# Patient Record
Sex: Male | Born: 1945 | Race: White | Hispanic: No | Marital: Married | Smoking: Current every day smoker
Health system: Southern US, Community
[De-identification: ages and names within clinical notes are randomized; demographics above are authoritative.]

## PROBLEM LIST (undated history)

## (undated) HISTORY — PX: BACK SURGERY: SHX140

---

## 2009-08-11 ENCOUNTER — Ambulatory Visit: Payer: Self-pay | Admitting: Unknown Physician Specialty

## 2009-09-23 ENCOUNTER — Encounter: Admission: RE | Admit: 2009-09-23 | Discharge: 2009-09-23 | Payer: Self-pay | Admitting: Neurosurgery

## 2009-11-05 ENCOUNTER — Encounter: Admission: RE | Admit: 2009-11-05 | Discharge: 2009-11-05 | Payer: Self-pay | Admitting: Neurosurgery

## 2010-06-28 ENCOUNTER — Inpatient Hospital Stay (HOSPITAL_COMMUNITY): Admission: RE | Admit: 2010-06-28 | Discharge: 2010-06-29 | Payer: Self-pay | Admitting: Neurosurgery

## 2010-12-22 LAB — CBC
HCT: 46.9 % (ref 39.0–52.0)
MCH: 31.3 pg (ref 26.0–34.0)
MCV: 91.8 fL (ref 78.0–100.0)
Platelets: 274 10*3/uL (ref 150–400)
RDW: 13.9 % (ref 11.5–15.5)

## 2010-12-22 LAB — DIFFERENTIAL
Basophils Absolute: 0.1 10*3/uL (ref 0.0–0.1)
Basophils Relative: 1 % (ref 0–1)
Eosinophils Absolute: 0.3 10*3/uL (ref 0.0–0.7)
Eosinophils Relative: 3 % (ref 0–5)
Monocytes Absolute: 0.7 10*3/uL (ref 0.1–1.0)
Monocytes Relative: 8 % (ref 3–12)

## 2010-12-22 LAB — PROTIME-INR
INR: 0.86 (ref 0.00–1.49)
Prothrombin Time: 11.9 seconds (ref 11.6–15.2)

## 2010-12-22 LAB — COMPREHENSIVE METABOLIC PANEL
AST: 19 U/L (ref 0–37)
Albumin: 4.1 g/dL (ref 3.5–5.2)
Chloride: 104 mEq/L (ref 96–112)
Creatinine, Ser: 0.84 mg/dL (ref 0.4–1.5)
Sodium: 142 mEq/L (ref 135–145)
Total Bilirubin: 0.2 mg/dL — ABNORMAL LOW (ref 0.3–1.2)

## 2010-12-22 LAB — URINALYSIS, ROUTINE W REFLEX MICROSCOPIC
Bilirubin Urine: NEGATIVE
Glucose, UA: NEGATIVE mg/dL
Hgb urine dipstick: NEGATIVE
Specific Gravity, Urine: 1.015 (ref 1.005–1.030)

## 2010-12-22 LAB — TYPE AND SCREEN

## 2010-12-22 LAB — SURGICAL PCR SCREEN
MRSA, PCR: NEGATIVE
Staphylococcus aureus: NEGATIVE

## 2011-08-31 IMAGING — RF DG LUMBAR SPINE 2-3V
1 series · 2 of 2 positions shown · non-contrast
Comparison: 11/05/2009

CLINICAL DATA: L5-S1 discectomy and fusion

LUMBAR SPINE - 2-3 VIEW

[Series 1: run · 2 of 2 slices shown]
[im 1/2]
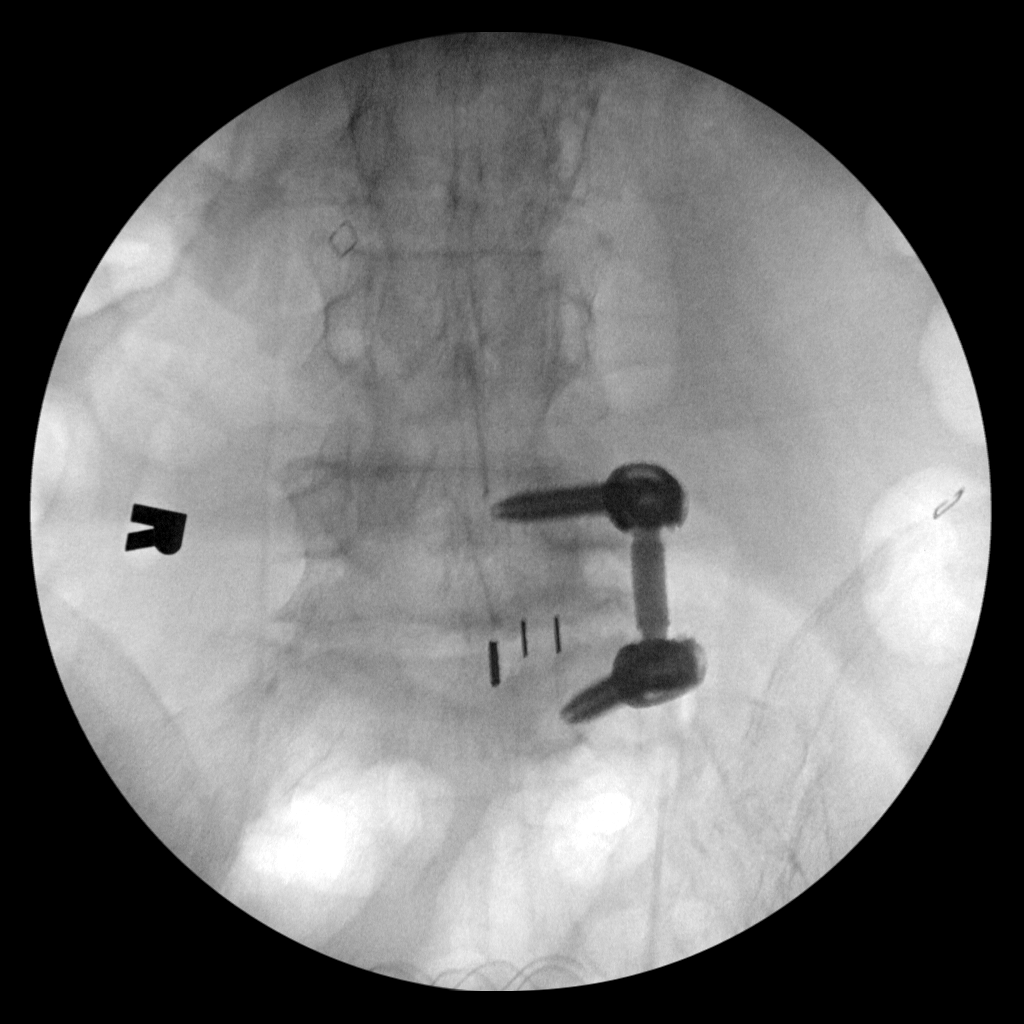
[im 2/2]
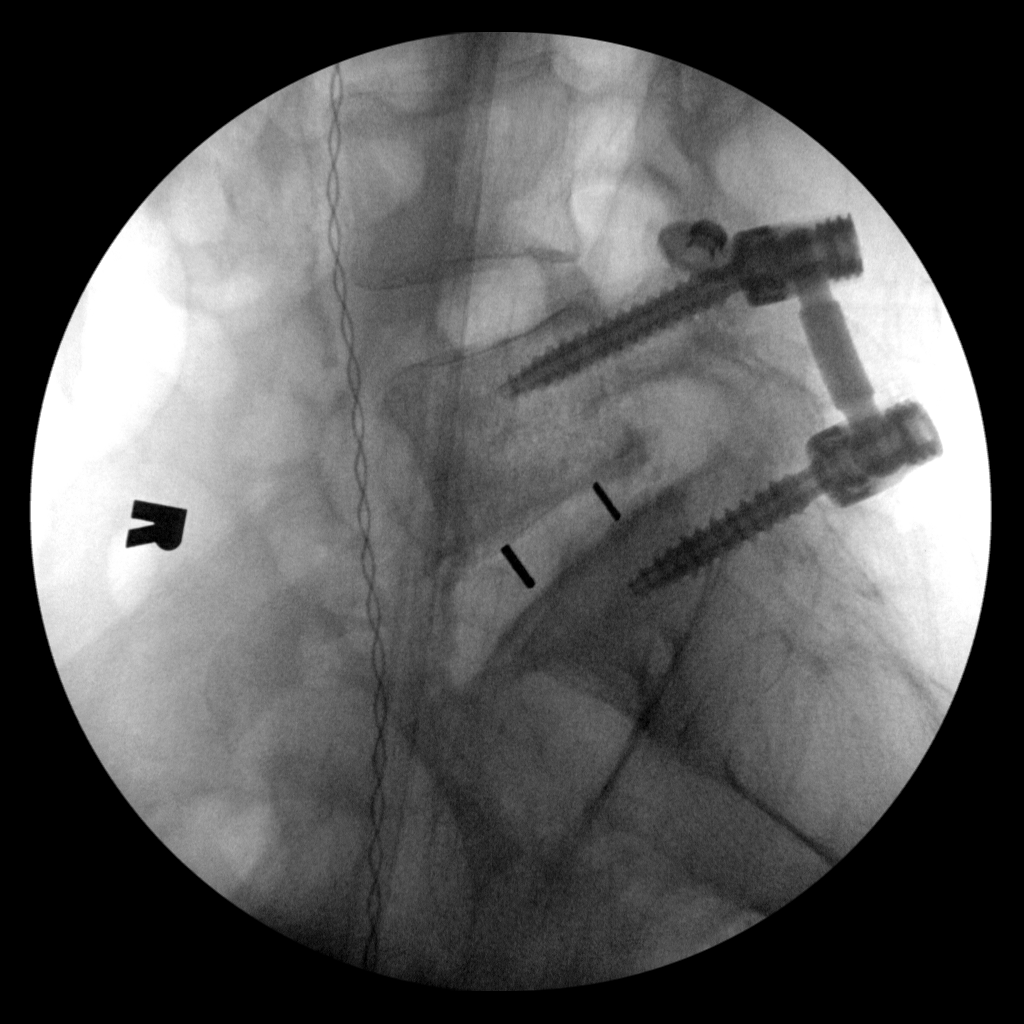

[2 of 2 positions shown; findings below may reference images not displayed]

FINDINGS: Two C-arm images show discectomy at L5-S1.  Interbody
fusion material is in place.  There are left-sided pedicle screws
and posterior connecting rods.  No radiographically detectable
complication.
IMPRESSION: Discectomy and fusion L5-S1.

## 2011-09-12 ENCOUNTER — Other Ambulatory Visit: Payer: Self-pay | Admitting: Neurosurgery

## 2011-09-12 DIAGNOSIS — M47816 Spondylosis without myelopathy or radiculopathy, lumbar region: Secondary | ICD-10-CM

## 2011-09-13 ENCOUNTER — Ambulatory Visit
Admission: RE | Admit: 2011-09-13 | Discharge: 2011-09-13 | Disposition: A | Discharge status: Home / Self Care | Payer: Federal, State, Local not specified - PPO | Source: Ambulatory Visit | Attending: Neurosurgery | Admitting: Neurosurgery

## 2011-09-13 DIAGNOSIS — M47816 Spondylosis without myelopathy or radiculopathy, lumbar region: Secondary | ICD-10-CM

## 2012-11-13 ENCOUNTER — Other Ambulatory Visit: Payer: Self-pay | Admitting: Neurosurgery

## 2012-11-13 DIAGNOSIS — M47817 Spondylosis without myelopathy or radiculopathy, lumbosacral region: Secondary | ICD-10-CM

## 2012-11-14 ENCOUNTER — Ambulatory Visit
Admission: RE | Admit: 2012-11-14 | Discharge: 2012-11-14 | Disposition: A | Discharge status: Home / Self Care | Payer: Medicare Other | Source: Ambulatory Visit | Attending: Neurosurgery | Admitting: Neurosurgery

## 2012-11-14 DIAGNOSIS — M47817 Spondylosis without myelopathy or radiculopathy, lumbosacral region: Secondary | ICD-10-CM

## 2013-09-17 ENCOUNTER — Ambulatory Visit: Payer: Self-pay | Admitting: Orthopedic Surgery

## 2014-06-10 ENCOUNTER — Emergency Department: Payer: Self-pay | Admitting: Emergency Medicine

## 2014-06-10 LAB — COMPREHENSIVE METABOLIC PANEL
ANION GAP: 10 (ref 7–16)
AST: 20 U/L (ref 15–37)
Albumin: 3.4 g/dL (ref 3.4–5.0)
Alkaline Phosphatase: 74 U/L
BUN: 12 mg/dL (ref 7–18)
Bilirubin,Total: 0.5 mg/dL (ref 0.2–1.0)
CHLORIDE: 100 mmol/L (ref 98–107)
CREATININE: 1.07 mg/dL (ref 0.60–1.30)
Calcium, Total: 8.8 mg/dL (ref 8.5–10.1)
Co2: 22 mmol/L (ref 21–32)
EGFR (African American): >60
EGFR (Non-African Amer.): >60
GLUCOSE: 158 mg/dL — AB (ref 65–99)
Osmolality: 268 (ref 275–301)
POTASSIUM: 3.6 mmol/L (ref 3.5–5.1)
SGPT (ALT): 14 U/L
SODIUM: 132 mmol/L — AB (ref 136–145)
TOTAL PROTEIN: 7.6 g/dL (ref 6.4–8.2)

## 2014-06-10 LAB — CBC WITH DIFFERENTIAL/PLATELET
BASOS ABS: 0.1 10*3/uL (ref 0.0–0.1)
Basophil %: 0.4 %
EOS ABS: 0 10*3/uL (ref 0.0–0.7)
EOS PCT: 0 %
HCT: 45.9 % (ref 40.0–52.0)
HGB: 15.4 g/dL (ref 13.0–18.0)
LYMPHS PCT: 3.9 %
Lymphocyte #: 0.6 10*3/uL — ABNORMAL LOW (ref 1.0–3.6)
MCH: 29.3 pg (ref 26.0–34.0)
MCHC: 33.6 g/dL (ref 32.0–36.0)
MCV: 87 fL (ref 80–100)
MONO ABS: 1.3 x10 3/mm — AB (ref 0.2–1.0)
Monocyte %: 8.4 %
NEUTROS PCT: 87.3 %
Neutrophil #: 13.5 10*3/uL — ABNORMAL HIGH (ref 1.4–6.5)
Platelet: 274 10*3/uL (ref 150–440)
RBC: 5.26 10*6/uL (ref 4.40–5.90)
RDW: 15.4 % — AB (ref 11.5–14.5)
WBC: 15.5 10*3/uL — ABNORMAL HIGH (ref 3.8–10.6)

## 2014-06-10 LAB — URINALYSIS, COMPLETE
Bilirubin,UR: NEGATIVE
GLUCOSE, UR: NEGATIVE mg/dL (ref 0–75)
LEUKOCYTE ESTERASE: NEGATIVE
Nitrite: NEGATIVE
Ph: 5 (ref 4.5–8.0)
Protein: 30
RBC,UR: 5 /HPF (ref 0–5)
SQUAMOUS EPITHELIAL: NONE SEEN
Specific Gravity: 1.019 (ref 1.003–1.030)
WBC UR: 4 /HPF (ref 0–5)

## 2014-06-15 LAB — CULTURE, BLOOD (SINGLE)

## 2015-12-17 ENCOUNTER — Emergency Department
Admission: EM | Admit: 2015-12-17 | Discharge: 2015-12-17 | Disposition: A | Discharge status: Home / Self Care | Payer: Medicare Other | Attending: Emergency Medicine | Admitting: Emergency Medicine

## 2015-12-17 ENCOUNTER — Encounter: Payer: Self-pay | Admitting: Medical Oncology

## 2015-12-17 DIAGNOSIS — G8929 Other chronic pain: Secondary | ICD-10-CM

## 2015-12-17 DIAGNOSIS — F17200 Nicotine dependence, unspecified, uncomplicated: Secondary | ICD-10-CM | POA: Diagnosis not present

## 2015-12-17 DIAGNOSIS — M545 Low back pain, unspecified: Secondary | ICD-10-CM

## 2015-12-17 MED ORDER — OXYCODONE-ACETAMINOPHEN 5-325 MG PO TABS
2.0000 | ORAL_TABLET | Freq: Once | ORAL | Status: AC
  Administered 2015-12-17: 2 via ORAL
  Filled 2015-12-17: qty 2

## 2015-12-17 MED ORDER — OXYCODONE-ACETAMINOPHEN 5-325 MG PO TABS: 1.0000 | ORAL_TABLET | ORAL | Status: DC | PRN

## 2015-12-17 MED ORDER — DIAZEPAM 2 MG PO TABS: 2.0000 mg | ORAL_TABLET | Freq: Three times a day (TID) | ORAL | Status: DC | PRN

## 2015-12-17 NOTE — ED Notes (Signed)
Pt to ed with c/o back pain x 3 days. Pt reports hx of several back surgeries but states about 3 days ago lifted some heavy objects and since then pain has increased in back.  Pt reports movement and activity makes pain worse.  States he tried otc meds without relief.

## 2015-12-17 NOTE — Discharge Instructions (Signed)
Follow-up with your back doctor  if any continued problems. You may also follow-[facility]. [facility]oot[facility] if any continued problems. Begin taking Percocet as needed for severe pain and diazepam as needed for muscle spasms. Did not take extra Tylenol with this medication as Percocet already has Tylenol in it. You may also use heat or ice to her back as needed for back discomfort.

## 2015-12-17 NOTE — ED Provider Notes (Signed)
[facility] Emergency Department Provider Note  ____________________________________________  Time seen: Approximately 12:46 PM  I have reviewed the triage vital signs and the nursing notes.   HISTORY  Chief Complaint Back Pain   HPI Adam Fuller is a 70 y.o. male chief complaint of low back pain for 3 days. Patient states that 3 days ago he lifted some heavy objects while he was sawing wood in his chart at home and has continued to have back pain since. He has a history of multiple surgeries on his back but was not currently taking any medication. He has had surgery in both [place] and [place]. He denies any direct trauma to his back or falling. Patient states he has been taking Tylenol without any relief. He denies any paresthesias or urinary symptoms. There is no history of kidney stones in the past. Patient went to the walk in clinic and was directed to the emergency room. Currently he rates his pain 9/10 at this time.   History reviewed. No pertinent past medical history.  There are no active problems to display for this patient.   Past Surgical History  Procedure Laterality Date  . Back surgery      Current Outpatient Rx  Name  Route  Sig  Dispense  Refill  . diazepam (VALIUM) 2 MG tablet   Oral   Take 1 tablet (2 mg total) by mouth every 8 (eight) hours as needed for muscle spasms.   9 tablet   0   . oxyCODONE-acetaminophen (PERCOCET) 5-325 MG tablet   Oral   Take 1 tablet by mouth every 4 (four) hours as needed for severe pain.   20 tablet   0     Allergies Review of patient's allergies indicates no known allergies.  No family history on file.  Social History Social History  Substance Use Topics  . Smoking status: Current Every Day Smoker  . Smokeless tobacco: None  . Alcohol Use: None    Review of Systems Constitutional: No fever/chills Cardiovascular: Denies chest pain. Respiratory: Denies shortness of  breath. Gastrointestinal: No abdominal pain.  No nausea, no vomiting.   Genitourinary: Negative for dysuria. Musculoskeletal: Positive for acute and chronic back pain Skin: Negative for rash. Neurological: Negative for headaches, focal weakness or numbness.  10-point ROS otherwise negative.  ____________________________________________   PHYSICAL EXAM:  VITAL SIGNS: ED Triage Vitals  Enc Vitals Group     BP 12/17/15 1157 171/96 mmHg     Pulse Rate 12/17/15 1157 65     Resp 12/17/15 1157 18     Temp 12/17/15 1157 97.8 F (36.6 C)     Temp Source 12/17/15 1157 Oral     SpO2 12/17/15 1157 100 %     Weight 12/17/15 1157 175 lb (79.379 kg)     Height 12/17/15 1157  (1.803 m)     Head Cir --      Peak Flow --      Pain Score 12/17/15 1158 9     Pain Loc --      Pain Edu? --      Excl. in GC? --     Constitutional: Alert and oriented. Well appearing and in no acute distress. Eyes: Conjunctivae are normal. PERRL. EOMI. Head: Atraumatic. Nose: No congestion/rhinnorhea. Neck: No stridor.   Cardiovascular: Normal rate, regular rhythm. Grossly normal heart sounds.  Good peripheral circulation. Respiratory: Normal respiratory effort.  No retractions. Lungs CTAB. Gastrointestinal: Soft and nontender. No distention.  Musculoskeletal:  Examination of back reveals well-healed surgical scars 2. No gross deformity was noted. There is tenderness on palpation of the lumbar paravertebral muscles bilaterally. Range of motion is restricted secondary to discomfort. Normal gait was noted. Straight leg raises were approximately 70 with some minimal discomfort. Neurologic:  Normal speech and language. No gross focal neurologic deficits are appreciated. No gait instability. Reflexes are 2+ bilaterally. Skin:  Skin is warm, dry and intact. No rash noted. Psychiatric: Mood and affect are normal. Speech and behavior are normal.  ____________________________________________   LABS (all labs  ordered are listed, but only abnormal results are displayed)  Labs Reviewed - No data to display  PROCEDURES  Procedure(s) performed: None  Critical Care performed: No  ____________________________________________   INITIAL IMPRESSION / ASSESSMENT AND PLAN / ED COURSE  Pertinent labs & imaging results that were available during my care of the patient were reviewed by me and considered in my medical decision making (see chart for details).  X-rays were deferred at this time due to no direct trauma to the back. Patient agrees that this most likely is muscle related due to his overactivity in the yard. Patient was given Percocet in the emergency room for pain. He will follow-up with his ____________________________________________   FINAL CLINICAL IMPRESSION(S) / ED DIAGNOSES  Final diagnoses:  Acute bilateral low back pain without sciatica  Chronic lower back pain      Tommi RumpsRhonda L Summers, PA-C 12/17/15 1437  Sharyn CreamerMark Quale, MD 12/17/15 [phone]

## 2015-12-17 NOTE — ED Notes (Signed)
Lower back pain that began a few days ago, has hx of chronic back problems with back surgery in the past

## 2017-12-17 ENCOUNTER — Emergency Department
Admission: EM | Admit: 2017-12-17 | Discharge: 2017-12-17 | Disposition: A | Discharge status: Home / Self Care | Payer: No Typology Code available for payment source | Attending: Emergency Medicine | Admitting: Emergency Medicine

## 2017-12-17 ENCOUNTER — Encounter: Payer: Self-pay | Admitting: Emergency Medicine

## 2017-12-17 ENCOUNTER — Other Ambulatory Visit: Payer: Self-pay

## 2017-12-17 ENCOUNTER — Emergency Department: Payer: No Typology Code available for payment source

## 2017-12-17 DIAGNOSIS — M7918 Myalgia, other site: Secondary | ICD-10-CM

## 2017-12-17 DIAGNOSIS — Y9389 Activity, other specified: Secondary | ICD-10-CM | POA: Insufficient documentation

## 2017-12-17 DIAGNOSIS — F17200 Nicotine dependence, unspecified, uncomplicated: Secondary | ICD-10-CM | POA: Diagnosis not present

## 2017-12-17 DIAGNOSIS — Y999 Unspecified external cause status: Secondary | ICD-10-CM | POA: Diagnosis not present

## 2017-12-17 DIAGNOSIS — M791 Myalgia, unspecified site: Secondary | ICD-10-CM | POA: Diagnosis not present

## 2017-12-17 DIAGNOSIS — Y92410 Unspecified street and highway as the place of occurrence of the external cause: Secondary | ICD-10-CM | POA: Insufficient documentation

## 2017-12-17 DIAGNOSIS — S51811A Laceration without foreign body of right forearm, initial encounter: Secondary | ICD-10-CM | POA: Diagnosis not present

## 2017-12-17 DIAGNOSIS — M79642 Pain in left hand: Secondary | ICD-10-CM | POA: Insufficient documentation

## 2017-12-17 DIAGNOSIS — S59811A Other specified injuries right forearm, initial encounter: Secondary | ICD-10-CM | POA: Diagnosis present

## 2017-12-17 MED ORDER — TRAMADOL HCL 50 MG PO TABS: 50.0000 mg | ORAL_TABLET | Freq: Four times a day (QID) | ORAL | 0 refills | Status: AC | PRN

## 2017-12-17 MED ORDER — SILVER SULFADIAZINE 1 % EX CREA
TOPICAL_CREAM | CUTANEOUS | Status: AC
  Filled 2017-12-17: qty 85

## 2017-12-17 MED ORDER — PHENYLEPHRINE HCL 10 % OP SOLN: Freq: Once | OPHTHALMIC | Status: DC

## 2017-12-17 MED ORDER — SILVER SULFADIAZINE 1 % EX CREA: TOPICAL_CREAM | CUTANEOUS | 1 refills | Status: AC

## 2017-12-17 MED ORDER — SILVER SULFADIAZINE 1 % EX CREA
TOPICAL_CREAM | Freq: Once | CUTANEOUS | Status: AC
  Administered 2017-12-17: 1 via TOPICAL

## 2017-12-17 MED ORDER — LIDOCAINE-EPINEPHRINE-TETRACAINE (LET) SOLUTION
NASAL | Status: AC
  Administered 2017-12-17: 3 mL
  Filled 2017-12-17: qty 3

## 2017-12-17 MED ORDER — LIDOCAINE-EPINEPHRINE-TETRACAINE (LET) TOPICAL GEL
9.0000 mL | Freq: Once | TOPICAL | Status: AC
  Administered 2017-12-17: 9 mL via TOPICAL
  Filled 2017-12-17: qty 9

## 2017-12-17 MED ORDER — OXYCODONE-ACETAMINOPHEN 5-325 MG PO TABS
1.0000 | ORAL_TABLET | Freq: Once | ORAL | Status: AC
  Administered 2017-12-17: 1 via ORAL
  Filled 2017-12-17: qty 1

## 2017-12-17 MED ORDER — CYCLOBENZAPRINE HCL 10 MG PO TABS: 10.0000 mg | ORAL_TABLET | Freq: Three times a day (TID) | ORAL | 0 refills | Status: DC | PRN
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
## 2017-12-17 NOTE — ED Notes (Signed)
Pt does not take any blood thinners. 
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
## 2017-12-20 ENCOUNTER — Encounter: Payer: Medicare Other | Attending: Physician Assistant | Admitting: Physician Assistant

## 2017-12-20 DIAGNOSIS — J449 Chronic obstructive pulmonary disease, unspecified: Secondary | ICD-10-CM | POA: Diagnosis not present

## 2017-12-20 DIAGNOSIS — I252 Old myocardial infarction: Secondary | ICD-10-CM | POA: Insufficient documentation

## 2017-12-20 DIAGNOSIS — S51811A Laceration without foreign body of right forearm, initial encounter: Secondary | ICD-10-CM | POA: Diagnosis present

## 2017-12-20 DIAGNOSIS — I10 Essential (primary) hypertension: Secondary | ICD-10-CM | POA: Insufficient documentation

## 2017-12-20 DIAGNOSIS — W2210XA Striking against or struck by unspecified automobile airbag, initial encounter: Secondary | ICD-10-CM | POA: Diagnosis not present

## 2017-12-20 DIAGNOSIS — I739 Peripheral vascular disease, unspecified: Secondary | ICD-10-CM | POA: Insufficient documentation
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
## 2017-12-23 NOTE — Progress Notes (Signed)
Adam, Fuller ([id number]) Visit Report for 12/20/2017 Allergy List Details Patient Name: Adam Fuller, Adam Fuller. Date of Service: 12/20/2017 8:00 AM Medical Record Number: [id number] Patient Account Number: [id number] Date of Birth/Sex: November 18, 1945 (72 y.o. Male) Treating RN: Curtis Sites Primary Care Alyxandria Wentz: Einar Crow Other Clinician: Referring Sonal Dorwart: Dionne Bucy Treating Temple Ewart/Extender: STONE III, HOYT Weeks in Treatment: 0 Allergies Active Allergies No Known Allergies Allergy Notes Electronic Signature(s) Signed: 12/20/2017 3:33:05 PM By: Curtis Sites Entered By: Curtis Sites on 12/20/2017 08:22:25 Adam Fuller ([id number]) -------------------------------------------------------------------------------- Arrival Information Details Patient Name: Adam Stabs L. Date of Service: 12/20/2017 8:00 AM Medical Record Number: [id number] Patient Account Number: [id number] Date of Birth/Sex: 1945/12/14 (72 y.o. Male) Treating RN: Curtis Sites Primary Care Edem Tiegs: Einar Crow Other Clinician: Referring Emran Molzahn: Dionne Bucy Treating Frankye Schwegel/Extender: Linwood Dibbles, HOYT Weeks in Treatment: 0 Visit Information Patient Arrived: Ambulatory Arrival Time: 08:20 Accompanied By: self Transfer Assistance: None Patient Identification Verified: Yes Secondary Verification Process Completed: Yes Electronic Signature(s) Signed: 12/20/2017 3:33:05 PM By: Curtis Sites Entered By: Curtis Sites on 12/20/2017 08:21:54 Adam Fuller ([id number]) -------------------------------------------------------------------------------- Clinic Level of Care Assessment Details Patient Name: Adam Stabs L. Date of Service: 12/20/2017 8:00 AM Medical Record Number: [id number] Patient Account Number: [id number] Date of Birth/Sex: [birth year] (72 y.o. Male) Treating RN: Ashok Cordia, [name] Primary Care Sharron Simpson: Einar Crow Other  Clinician: Referring Loyda Costin: Dionne Bucy Treating Bridgett Hattabaugh/Extender: Linwood Dibbles, HOYT Weeks in Treatment: 0 Clinic Level of Care Assessment Items TOOL 1 Quantity Score X - Use when EandM and Procedure is performed on INITIAL visit 1 0 ASSESSMENTS - Nursing Assessment / Reassessment X - General Physical Exam (combine w/ comprehensive assessment (listed just below) when 1 20 performed on new pt. evals) X- 1 25 Comprehensive Assessment (HX, ROS, Risk Assessments, Wounds Hx, etc.) ASSESSMENTS - Wound and Skin Assessment / Reassessment []  - Dermatologic / Skin Assessment (not related to wound area) 0 ASSESSMENTS - Ostomy and/or Continence Assessment and Care []  - Incontinence Assessment and Management 0 []  - 0 Ostomy Care Assessment and Management (repouching, etc.) PROCESS - Coordination of Care X - Simple Patient / Family Education for ongoing care 1 15 []  - 0 Complex (extensive) Patient / Family Education for ongoing care []  - 0 Staff obtains Chiropractor, Records, Test Results / Process Orders []  - 0 Staff telephones HHA, Nursing Homes / Clarify orders / etc []  - 0 Routine Transfer to another Facility (non-emergent condition) []  - 0 Routine Hospital Admission (non-emergent condition) X- 1 15 New Admissions / Manufacturing engineer / Ordering NPWT, Apligraf, etc. []  - 0 Emergency Hospital Admission (emergent condition) PROCESS - Special Needs []  - Pediatric / Minor Patient Management 0 []  - 0 Isolation Patient Management []  - 0 Hearing / Language / Visual special needs []  - 0 Assessment of Community assistance (transportation, D/C planning, etc.) []  - 0 Additional assistance / Altered mentation []  - 0 Support Surface(s) Assessment (bed, cushion, seat, etc.) Adam Fuller, Adam L. ([id number]) INTERVENTIONS - Miscellaneous []  - External ear exam 0 []  - 0 Patient Transfer (multiple staff / Nurse, adult / Similar devices) []  - 0 Simple Staple / Suture removal (25 or  less) []  - 0 Complex Staple / Suture removal (26 or more) []  - 0 Hypo/Hyperglycemic Management (do not check if billed separately) []  - 0 Ankle / Brachial Index (ABI) - do not check if billed separately Has the patient been seen at the hospital within the last three years: Yes Total Score: 75 Level Of Care: New/Established - Level 2  Electronic Signature(s) Signed: 12/20/2017 2:24:57 PM By: Alejandro Mulling Entered By: Alejandro Mulling on 12/20/2017 11:40:49 Adam Fuller ([id number]) -------------------------------------------------------------------------------- Encounter Discharge Information Details Patient Name: Adam, [name] Date of Service: 12/20/2017 8:00 AM Medical Record Number: [id number] Patient Account Number: [id number] Date of Birth/Sex: 02-18-1946 (72 y.o. Male) Treating RN: Phillis Haggis Primary Care Hetvi Shawhan: Einar Crow Other Clinician: Referring Ekin Pilar: Dionne Bucy Treating Zariah Cavendish/Extender: Linwood Dibbles, HOYT Weeks in Treatment: 0 Encounter Discharge Information Items Discharge Pain Level: 0 Discharge Condition: Stable Ambulatory Status: Ambulatory Discharge Destination: Home Transportation: Private Auto Schedule Follow-up Appointment: Yes Medication Reconciliation completed and No provided to Patient/Care Federico Maiorino: Provided on Clinical Summary of Care: 12/20/2017 Form Type Recipient Paper Patient MB Electronic Signature(s) Signed: 12/20/2017 2:35:19 PM By: Renne Crigler Entered By: Renne Crigler on 12/20/2017 09:01:49 Adam Fuller ([id number]) -------------------------------------------------------------------------------- Lower Extremity Assessment Details Patient Name: Adam Stabs L. Date of Service: 12/20/2017 8:00 AM Medical Record Number: [id number] Patient Account Number: [id number] Date of Birth/Sex: 04/23/1946 (72 y.o. Male) Treating RN: Curtis Sites Primary Care Lesslie Mckeehan: Einar Crow Other  Clinician: Referring Tawonna Esquer: Dionne Bucy Treating Taccara Bushnell/Extender: Linwood Dibbles, HOYT Weeks in Treatment: 0 Electronic Signature(s) Signed: 12/20/2017 3:33:05 PM By: Curtis Sites Entered By: Curtis Sites on 12/20/2017 08:37:42 Adam Fuller ([id number]) -------------------------------------------------------------------------------- Multi Wound Chart Details Patient Name: Adam Stabs L. Date of Service: 12/20/2017 8:00 AM Medical Record Number: [id number] Patient Account Number: [id number] Date of Birth/Sex: December 25, 1945 (72 y.o. Male) Treating RN: Phillis Haggis Primary Care Damen Windsor: Einar Crow Other Clinician: Referring Banessa Mao: Dionne Bucy Treating Charleton Deyoung/Extender: Linwood Dibbles, HOYT Weeks in Treatment: 0 Vital Signs Height(in): 71 Pulse(bpm): 78 Weight(lbs): 175 Blood Pressure(mmHg): 146/84 Body Mass Index(BMI): 24 Temperature(F): 98.3 Respiratory Rate 18 (breaths/min): Photos: [1:No Photos] [N/A:N/A] Wound Location: [1:Right Forearm - Anterior] [N/A:N/A] Wounding Event: [1:Trauma] [N/A:N/A] Primary Etiology: [1:Trauma, Other] [N/A:N/A] Comorbid History: [1:Chronic Obstructive Pulmonary Disease (COPD)] [N/A:N/A] Date Acquired: [1:12/17/2017] [N/A:N/A] Weeks of Treatment: [1:0] [N/A:N/A] Wound Status: [1:Open] [N/A:N/A] Measurements L x W x D [1:6.5x4x0.1] [N/A:N/A] (cm) Area (cm) : [1:20.42] [N/A:N/A] Volume (cm) : [1:2.042] [N/A:N/A] Classification: [1:Partial Thickness] [N/A:N/A] Exudate Amount: [1:Large] [N/A:N/A] Exudate Type: [1:Serous] [N/A:N/A] Exudate Color: [1:amber] [N/A:N/A] Wound Margin: [1:Flat and Intact] [N/A:N/A] Granulation Amount: [1:Medium (34-66%)] [N/A:N/A] Granulation Quality: [1:Pink] [N/A:N/A] Necrotic Amount: [1:Medium (34-66%)] [N/A:N/A] Exposed Structures: [1:Fascia: No Fat Layer (Subcutaneous Tissue) Exposed: No Tendon: No Muscle: No Joint: No Bone: No] [N/A:N/A] Epithelialization: [1:Small  (1-33%)] [N/A:N/A] Periwound Skin Texture: [1:Excoriation: No Induration: No Callus: No Crepitus: No Rash: No Scarring: No] [N/A:N/A] Periwound Skin Moisture: [1:Maceration: No Dry/Scaly: No] [N/A:N/A] Periwound Skin Color: Ecchymosis: Yes N/A N/A Atrophie Blanche: No Cyanosis: No Erythema: No Hemosiderin Staining: No Mottled: No Pallor: No Rubor: No Temperature: No Abnormality N/A N/A Tenderness on Palpation: Yes N/A N/A Wound Preparation: Ulcer Cleansing: N/A N/A Rinsed/Irrigated with Saline Topical Anesthetic Applied: Other: lidocaine 4% Treatment Notes Electronic Signature(s) Signed: 12/20/2017 2:24:57 PM By: Alejandro Mulling Entered By: Alejandro Mulling on 12/20/2017 08:47:58 Adam Fuller ([id number]) -------------------------------------------------------------------------------- Multi-Disciplinary Care Plan Details Patient Name: Adam Fuller, Adam L. Date of Service: 12/20/2017 8:00 AM Medical Record Number: [id number] Patient Account Number: [id number] Date of Birth/Sex: 1945/10/21 (72 y.o. Male) Treating RN: Ashok Cordia, [name] Primary Care Numan Zylstra: Einar Crow Other Clinician: Referring Aneta Hendershott: Dionne Bucy Treating Mali Eppard/Extender: Linwood Dibbles, HOYT Weeks in Treatment: 0 Active Inactive ` Nutrition Nursing Diagnoses: Imbalanced nutrition Potential for alteratiion in Nutrition/Potential for imbalanced nutrition Goals: Patient/caregiver agrees to and verbalizes understanding of need to use nutritional supplements and/or vitamins as prescribed Date Initiated: 12/20/2017 Target  Resolution Date: 05/18/2018 Goal Status: Active Interventions: Assess patient nutrition upon admission and as needed per policy Notes: ` Orientation to the Wound Care Program Nursing Diagnoses: Knowledge deficit related to the wound healing center program Goals: Patient/caregiver will verbalize understanding of the Wound Healing Center Program Date Initiated:  12/20/2017 Target Resolution Date: 01/12/2018 Goal Status: Active Interventions: Provide education on orientation to the wound center Notes: ` Wound/Skin Impairment Nursing Diagnoses: Impaired tissue integrity Knowledge deficit related to smoking impact on wound healing Knowledge deficit related to ulceration/compromised skin integrity Goals: Ulcer/skin breakdown will have a volume reduction of 80% by week 12 Date Initiated: 12/20/2017 Target Resolution Date: 04/06/2018 Adam Fuller, Adam L. ([id number]) Goal Status: Active Interventions: Assess patient/caregiver ability to perform ulcer/skin care regimen upon admission and as needed Assess ulceration(s) every visit Provide education on smoking Notes: Electronic Signature(s) Signed: 12/20/2017 2:24:57 PM By: Alejandro MullingPinkerton, Debra Entered By: Alejandro MullingPinkerton, Debra on 12/20/2017 08:47:48 Adam Fuller, Adam HammockMICHAEL L. ([id number]) -------------------------------------------------------------------------------- Pain Assessment Details Patient Name: Adam StabsBRADSHER, Adam L. Date of Service: 12/20/2017 8:00 AM Medical Record Number: [id number] Patient Account Number: [id number] Date of Birth/Sex: 12/02/1945 (72 y.o. Male) Treating RN: [name], [name] Primary Care Jaleesa Cervi: [name], [name] Other Clinician: Referring Hue Steveson: Dionne BucySIADECKI, SEBASTIAN Treating Alif Petrak/Extender: Linwood DibblesSTONE III, HOYT Weeks in Treatment: 0 Active Problems Location of Pain Severity and Description of Pain Patient Has Paino Yes Site Locations Pain Location: Pain in Ulcers With Dressing Change: Yes Duration of the Pain. Constant / Intermittento Constant Character of Pain Describe the Pain: Burning Pain Management and Medication Current Pain Management: Notes Topical or injectable lidocaine is offered to patient for acute pain when surgical debridement is performed. If needed, Patient is instructed to use over the counter pain medication for the following 24-48 hours after debridement.  Wound care MDs do not prescribed pain medications. Patient has chronic pain or uncontrolled pain. Patient has been instructed to make an appointment with their Primary Care Physician for pain management. Electronic Signature(s) Signed: 12/20/2017 3:33:05 PM By: [name], [name] Entered By: [name], [name] on 12/20/2017 08:22:12 Adam Fuller, Ala L. ([id number]) -------------------------------------------------------------------------------- Patient/Caregiver Education Details Patient Name: Adam Fuller, Adam L. Date of Service: 12/20/2017 8:00 AM Medical Record Number: [id number] Patient Account Number: [id number] Date of Birth/Gender: 10/25/1945 (72 y.o. Male) Treating RN: Renne CriglerFlinchum, Cheryl Primary Care Physician: [name], [name] Other Clinician: Referring Physician: Dionne BucySIADECKI, SEBASTIAN Treating Physician/Extender: Skeet SimmerSTONE III, HOYT Weeks in Treatment: 0 Education Assessment Education Provided To: Patient Education Topics Provided Smoking and Wound Healing: Handouts: Smoking and Wound Healing Methods: Explain/Verbal Responses: State content correctly Welcome To The [facility]: Handouts: Welcome To The [facility] Methods: Explain/Verbal Responses: State content correctly Wound Debridement: Methods: Explain/Verbal Responses: State content correctly Wound/Skin Impairment: Handouts: Caring for Your Ulcer Methods: Explain/Verbal Responses: State content correctly Electronic Signature(s) Signed: 12/20/2017 2:35:19 PM By: Renne CriglerFlinchum, Cheryl Entered By: Renne CriglerFlinchum, Cheryl on 12/20/2017 09:02:19 Adam Fuller, Keyaan L. ([id number]) -------------------------------------------------------------------------------- Wound Assessment Details Patient Name: Adam Fuller, Adam L. Date of Service: 12/20/2017 8:00 AM Medical Record Number: [id number] Patient Account Number: [id number] Date of Birth/Sex: 12/09/1945 (72 y.o. Male) Treating RN: [name], [name] Primary Care Twain Stenseth: [name],  [name] Other Clinician: Referring Ting Cage: Dionne BucySIADECKI, SEBASTIAN Treating Laurrie Toppin/Extender: Linwood DibblesSTONE III, HOYT Weeks in Treatment: 0 Wound Status Wound Number: 1 Primary Trauma, Other Etiology: Wound Location: Right Forearm - Anterior Wound Status: Open Wounding Event: Trauma Comorbid Chronic Obstructive Pulmonary Disease Date Acquired: 12/17/2017 History: (COPD) Weeks Of Treatment: 0 Clustered Wound: No Photos Photo Uploaded By: [name], [name] on 12/20/2017 10:34:28 Wound Measurements Length: (cm) 6.5 Width: (cm) 4 Depth: (  cm) 0.1 Area: (cm) 20.42 Volume: (cm) 2.042 % Reduction in Area: % Reduction in Volume: Epithelialization: Small (1-33%) Tunneling: No Undermining: No Wound Description Classification: Partial Thickness Wound Margin: Flat and Intact Exudate Amount: Large Exudate Type: Serous Exudate Color: amber Foul Odor After Cleansing: No Slough/Fibrino Yes Wound Bed Granulation Amount: Medium (34-66%) Exposed Structure Granulation Quality: Pink Fascia Exposed: No Necrotic Amount: Medium (34-66%) Fat Layer (Subcutaneous Tissue) Exposed: No Necrotic Quality: Adherent Slough Tendon Exposed: No Muscle Exposed: No Joint Exposed: No Bone Exposed: No Periwound Skin Texture Adam Fuller, Adam L. ([id number]) Texture Color No Abnormalities Noted: No No Abnormalities Noted: No Callus: No Atrophie Blanche: No Crepitus: No Cyanosis: No Excoriation: No Ecchymosis: Yes Induration: No Erythema: No Rash: No Hemosiderin Staining: No Scarring: No Mottled: No Pallor: No Moisture Rubor: No No Abnormalities Noted: No Dry / Scaly: No Temperature / Pain Maceration: No Temperature: No Abnormality Tenderness on Palpation: Yes Wound Preparation Ulcer Cleansing: Rinsed/Irrigated with Saline Topical Anesthetic Applied: Other: lidocaine 4%, Treatment Notes Wound #1 (Right, Anterior Forearm) 1. Cleansed with: Clean wound with Normal Saline 2.  Anesthetic Topical Lidocaine 4% cream to wound bed prior to debridement 4. Dressing Applied: Xeroform 5. Secondary Dressing Applied Non-Adherent pad Notes conform with tape cover with stretch net Electronic Signature(s) Signed: 12/20/2017 3:33:05 PM By: Curtis Sites Entered By: Curtis Sites on 12/20/2017 08:37:23 Adam Fuller ([id number]) -------------------------------------------------------------------------------- Vitals Details Patient Name: Adam Fuller, Adam L. Date of Service: 12/20/2017 8:00 AM Medical Record Number: [id number] Patient Account Number: [id number] Date of Birth/Sex: September 01, 1946 (72 y.o. Male) Treating RN: Curtis Sites Primary Care Naziah Portee: Einar Crow Other Clinician: Referring Laden Fieldhouse: Dionne Bucy Treating Kelijah Towry/Extender: Linwood Dibbles, HOYT Weeks in Treatment: 0 Vital Signs Time Taken: 08:27 Temperature (F): 98.3 Height (in): 71 Pulse (bpm): 78 Source: Measured Respiratory Rate (breaths/min): 18 Weight (lbs): 175 Blood Pressure (mmHg): 146/84 Source: Measured Reference Range: 80 - 120 mg / dl Body Mass Index (BMI): 24.4 Electronic Signature(s) Signed: 12/20/2017 3:33:05 PM By: Curtis Sites Entered By: Curtis Sites on 12/20/2017 08:65:78

## 2017-12-23 NOTE — Progress Notes (Signed)
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 08:27:22 Adam Fuller ([id number]) -------------------------------------------------------------------------------- SuperBill Details Patient Name: Adam Stabs L. Date of Service: 12/20/2017 Medical Record Number: [id number] Patient Account Number: [id number] Date of Birth/Sex: [birth year] (72 y.o. Male) Treating RN: Ashok Cordia, [name] Primary Care Provider: Einar Crow Other Clinician: Referring Provider: Dionne Bucy Treating Provider/Extender: Linwood Dibbles, Breckin Zafar Weeks in Treatment: 0 Diagnosis Coding ICD-10 Codes Code Description S51.801A Unspecified open wound of right forearm, initial encounter L98.492 Non-pressure chronic ulcer of skin of other sites with fat layer  exposed Facility Procedures CPT4 Code: 62952841 Description: [id number] - WOUND CARE VISIT-LEV 2 EST PT Modifier: Quantity: 1 CPT4 Code: 10272536 Description: 11042 - DEB SUBQ TISSUE 20 SQ CM/< ICD-10 Diagnosis Description 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 Non-pressure chronic ulcer of skin of other sites with fat la Modifier: yer exposed Quantity: 1 CPT4 Code: 64403474 Description: 11045 - DEB SUBQ TISS EA ADDL 20CM ICD-10 Diagnosis Description 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 Non-pressure chronic ulcer of skin of other sites with fat la Modifier: yer exposed Quantity: 1 Physician Procedures CPT4 Code: 2595638 Description: 99213 - WC PHYS LEVEL 3 - EST PT ICD-10 Diagnosis Description S51.801A Unspecified open wound of right forearm, initial encounter 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

## 2017-12-27 ENCOUNTER — Encounter: Payer: Medicare Other | Admitting: Nurse Practitioner

## 2017-12-27 DIAGNOSIS — S51811A Laceration without foreign body of right forearm, initial encounter: Secondary | ICD-10-CM | POA: Diagnosis not present

## 2018-01-03 ENCOUNTER — Encounter: Payer: Medicare Other | Admitting: Nurse Practitioner

## 2018-01-03 DIAGNOSIS — S51811A Laceration without foreign body of right forearm, initial encounter: Secondary | ICD-10-CM | POA: Diagnosis not present
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
## 2018-01-06 NOTE — Progress Notes (Signed)
Adam Fuller, Adam L. ([id number]) Visit Report for 01/03/2018 Arrival Information Details Patient Name: Adam Fuller, Adam L. Date of Service: 01/03/2018 9:30 AM Medical Record Number: [id number] Patient Account Number: [id number] Date of Birth/Sex: 11/29/1945 (72 y.o. M) Treating RN: [name], [name] Primary Care Chasey Dull: [name], [name] Other Clinician: Referring Ranvir Renovato: [name], [name] Treating Beautifull Cisar/Extender: [name], [name]: 2 Visit Information History Since Last Visit Added or deleted any medications: No Patient Arrived: Ambulatory Any new allergies or adverse reactions: No Arrival Time: 09:40 Had a fall or experienced change in No Accompanied By: self activities of daily living that may affect Transfer Assistance: None risk of falls: Patient Identification Verified: Yes Signs or symptoms of abuse/neglect since last visito No Secondary Verification Process Completed: Yes Hospitalized since last visit: No Implantable device outside of the clinic excluding No cellular tissue based products placed in the center since last visit: Has Dressing in Place as Prescribed: Yes Pain Present Now: No Electronic Signature(s) Signed: 01/03/2018 4:03:23 PM By: [name], [name] Entered By: [name], [name] on 01/03/2018 09:40:22 Adam Fuller, Adam L. ([id number]) -------------------------------------------------------------------------------- Clinic Level of Care Assessment Details Patient Name: Adam Fuller, Adam L. Date of Service: 01/03/2018 9:30 AM Medical Record Number: [id number] Patient Account Number: [id number] Date of Birth/Sex: 01/25/1946 (72 y.o. M) Treating RN: [name], [name] Primary Care Mahari Strahm: [name], [name] Other Clinician: Referring Kru Allman: [name], [name] Treating Adyson Vanburen/Extender: [name], [name]: 2 Clinic Level of Care Assessment Items TOOL 4 Quantity Score X - Use when only an EandM is performed on FOLLOW-UP visit 1  0 ASSESSMENTS - Nursing Assessment / Reassessment X - Reassessment of Co-morbidities (includes updates in patient status) 1 10 X- 1 5 Reassessment of Adherence to Treatment Plan ASSESSMENTS - Wound and Skin Assessment / Reassessment X - Simple Wound Assessment / Reassessment - one wound 1 5 []  - 0 Complex Wound Assessment / Reassessment - multiple wounds []  - 0 Dermatologic / Skin Assessment (not related to wound area) ASSESSMENTS - Focused Assessment []  - Circumferential Edema Measurements - multi extremities 0 []  - 0 Nutritional Assessment / Counseling / Intervention []  - 0 Lower Extremity Assessment (monofilament, tuning fork, pulses) []  - 0 Peripheral Arterial Disease Assessment (using hand held doppler) ASSESSMENTS - Ostomy and/or Continence Assessment and Care []  - Incontinence Assessment and Management 0 []  - 0 Ostomy Care Assessment and Management (repouching, etc.) PROCESS - Coordination of Care X - Simple Patient / Family Education for ongoing care 1 15 []  - 0 Complex (extensive) Patient / Family Education for ongoing care []  - 0 Staff obtains ChiropractorConsents, Records, Test Results / Process Orders []  - 0 Staff telephones HHA, Nursing Homes / Clarify orders / etc []  - 0 Routine Transfer to another Facility (non-emergent condition) []  - 0 Routine Hospital Admission (non-emergent condition) []  - 0 New Admissions / Manufacturing engineernsurance Authorizations / Ordering NPWT, Apligraf, etc. []  - 0 Emergency Hospital Admission (emergent condition) X- 1 10 Simple Discharge Coordination Adam StabsBRADSHER, Amarii L. ([id number]) []  - 0 Complex (extensive) Discharge Coordination PROCESS - Special Needs []  - Pediatric / Minor Patient Management 0 []  - 0 Isolation Patient Management []  - 0 Hearing / Language / Visual special needs []  - 0 Assessment of Community assistance (transportation, D/C planning, etc.) []  - 0 Additional assistance / Altered mentation []  - 0 Support Surface(s) Assessment  (bed, cushion, seat, etc.) INTERVENTIONS - Wound Cleansing / Measurement X - Simple Wound Cleansing - one wound 1 5 []  - 0 Complex Wound Cleansing - multiple wounds X- 1 5 Wound Imaging (photographs -  any number of wounds) []  - 0 Wound Tracing (instead of photographs) X- 1 5 Simple Wound Measurement - one wound []  - 0 Complex Wound Measurement - multiple wounds INTERVENTIONS - Wound Dressings X - Small Wound Dressing one or multiple wounds 1 10 []  - 0 Medium Wound Dressing one or multiple wounds []  - 0 Large Wound Dressing one or multiple wounds X- 1 5 Application of Medications - topical []  - 0 Application of Medications - injection INTERVENTIONS - Miscellaneous []  - External ear exam 0 []  - 0 Specimen Collection (cultures, biopsies, blood, body fluids, etc.) []  - 0 Specimen(s) / Culture(s) sent or taken to Lab for analysis []  - 0 Patient Transfer (multiple staff / Nurse, adult / Similar devices) []  - 0 Simple Staple / Suture removal (25 or less) []  - 0 Complex Staple / Suture removal (26 or more) []  - 0 Hypo / Hyperglycemic Management (close monitor of Blood Glucose) []  - 0 Ankle / Brachial Index (ABI) - do not check if billed separately X- 1 5 Vital Signs Fariss, Joren L. ([id number]) Has the patient been seen at the hospital within the last three years: Yes Total Score: 80 Level Of Care: New/Established - Level 3 Electronic Signature(s) Signed: 01/03/2018 4:15:03 PM By: Alejandro Mulling Entered By: Alejandro Mulling on 01/03/2018 09:58:17 Adam Fuller ([id number]) -------------------------------------------------------------------------------- Encounter Discharge Information Details Patient Name: Adam Stabs L. Date of Service: 01/03/2018 9:30 AM Medical Record Number: [id number] Patient Account Number: [id number] Date of Birth/Sex: Jun 15, 1946 (72 y.o. M) Treating RN: Phillis Haggis Primary Care Jarrod Mcenery: Einar Crow Other  Clinician: Referring Citlalic Norlander: Einar Crow Treating Necia Kamm/Extender: Kathreen Cosier in Treatment: 2 Encounter Discharge Information Items Discharge Pain Level: 0 Discharge Condition: Stable Ambulatory Status: Ambulatory Discharge Destination: Home Transportation: Private Auto Schedule Follow-up Appointment: Yes Medication Reconciliation completed and No provided to Patient/Care Tyrone Pautsch: Provided on Clinical Summary of Care: 01/03/2018 Form Type Recipient Paper Patient MB Electronic Signature(s) Signed: 01/04/2018 4:26:13 PM By: Renne Crigler Entered By: Renne Crigler on 01/03/2018 10:09:18 Adam Fuller ([id number]) -------------------------------------------------------------------------------- Lower Extremity Assessment Details Patient Name: Adam Stabs L. Date of Service: 01/03/2018 9:30 AM Medical Record Number: [id number] Patient Account Number: [id number] Date of Birth/Sex: August 13, 1946 (72 y.o. M) Treating RN: Curtis Sites Primary Care Stanislaus Kaltenbach: Einar Crow Other Clinician: Referring Corrie Brannen: Einar Crow Treating Ahmeer Tuman/Extender: Bonnell Public Weeks in Treatment: 2 Electronic Signature(s) Signed: 01/03/2018 4:03:23 PM By: Curtis Sites Entered By: Curtis Sites on 01/03/2018 09:45:54 Stammer, Marolyn Hammock ([id number]) -------------------------------------------------------------------------------- Multi Wound Chart Details Patient Name: DUERST, Vlad L. Date of Service: 01/03/2018 9:30 AM Medical Record Number: [id number] Patient Account Number: [id number] Date of Birth/Sex: [birth year] (72 y.o. M) Treating RN: Phillis Haggis Primary Care Kyah Buesing: Einar Crow Other Clinician: Referring Lorine Iannaccone: Einar Crow Treating Brittlyn Cloe/Extender: Kathreen Cosier in Treatment: 2 Vital Signs Height(in): 71 Pulse(bpm): 88 Weight(lbs): 175 Blood Pressure(mmHg): 137/85 Body Mass Index(BMI): 24 Temperature(F):  97.9 Respiratory Rate 16 (breaths/min): Photos: [1:No Photos] [N/A:N/A] Wound Location: [1:Right Forearm - Anterior] [N/A:N/A] Wounding Event: [1:Trauma] [N/A:N/A] Primary Etiology: [1:Trauma, Other] [N/A:N/A] Comorbid History: [1:Chronic Obstructive Pulmonary Disease (COPD)] [N/A:N/A] Date Acquired: [1:12/17/2017] [N/A:N/A] Weeks of Treatment: [1:2] [N/A:N/A] Wound Status: [1:Open] [N/A:N/A] Measurements L x W x D [1:5.2x0.5x0.1] [N/A:N/A] (cm) Area (cm) : [1:2.042] [N/A:N/A] Volume (cm) : [1:0.204] [N/A:N/A] % Reduction in Area: [1:90.00%] [N/A:N/A] % Reduction in Volume: [1:90.00%] [N/A:N/A] Classification: [1:Partial Thickness] [N/A:N/A] Exudate Amount: [1:Medium] [N/A:N/A] Exudate Type: [1:Serous] [N/A:N/A] Exudate Color: [1:amber] [N/A:N/A] Wound Margin: [1:Flat and Intact] [N/A:N/A] Granulation Amount: [1:Large (67-100%)] [N/A:N/A] Granulation  Quality: [1:Pink] [N/A:N/A] Necrotic Amount: [1:Small (1-33%)] [N/A:N/A] Exposed Structures: [1:Fat Layer (Subcutaneous Tissue) Exposed: Yes Fascia: No Tendon: No Muscle: No Joint: No Bone: No] [N/A:N/A] Epithelialization: [1:Medium (34-66%)] [N/A:N/A] Periwound Skin Texture: [1:Scarring: Yes Excoriation: No Induration: No Callus: No Crepitus: No Rash: No] [N/A:N/A] Periwound Skin Moisture: Maceration: No N/A N/A Dry/Scaly: No Periwound Skin Color: Ecchymosis: Yes N/A N/A Atrophie Blanche: No Cyanosis: No Erythema: No Hemosiderin Staining: No Mottled: No Pallor: No Rubor: No Temperature: No Abnormality N/A N/A Tenderness on Palpation: Yes N/A N/A Wound Preparation: Ulcer Cleansing: N/A N/A Rinsed/Irrigated with Saline Topical Anesthetic Applied: Other: lidocaine 4% Treatment Notes Electronic Signature(s) Signed: 01/03/2018 9:54:07 AM By: Bonnell Public Entered By: Bonnell Public on 01/03/2018 09:54:06 Adam Fuller  ([id number]) -------------------------------------------------------------------------------- Multi-Disciplinary Care Plan Details Patient Name: EMERALD, GEHRES L. Date of Service: 01/03/2018 9:30 AM Medical Record Number: [id number] Patient Account Number: [id number] Date of Birth/Sex: 02-10-1946 (72 y.o. M) Treating RN: Phillis Haggis Primary Care Dechelle Attaway: Einar Crow Other Clinician: Referring Marrion Accomando: Einar Crow Treating Jlynn Ly/Extender: Kathreen Cosier in Treatment: 2 Active Inactive ` Nutrition Nursing Diagnoses: Imbalanced nutrition Potential for alteratiion in Nutrition/Potential for imbalanced nutrition Goals: Patient/caregiver agrees to and verbalizes understanding of need to use nutritional supplements and/or vitamins as prescribed Date Initiated: 12/20/2017 Target Resolution Date: 05/18/2018 Goal Status: Active Interventions: Assess patient nutrition upon admission and as needed per policy Notes: ` Orientation to the Wound Care Program Nursing Diagnoses: Knowledge deficit related to the wound healing center program Goals: Patient/caregiver will verbalize understanding of the Wound Healing Center Program Date Initiated: 12/20/2017 Target Resolution Date: 01/12/2018 Goal Status: Active Interventions: Provide education on orientation to the wound center Notes: ` Wound/Skin Impairment Nursing Diagnoses: Impaired tissue integrity Knowledge deficit related to smoking impact on wound healing Knowledge deficit related to ulceration/compromised skin integrity Goals: Ulcer/skin breakdown will have a volume reduction of 80% by week 12 Date Initiated: 12/20/2017 Target Resolution Date: 04/06/2018 MICHELLE, VANHISE ([id number]) Goal Status: Active Interventions: Assess patient/caregiver ability to perform ulcer/skin care regimen upon admission and as needed Assess ulceration(s) every visit Provide education on smoking Notes: Electronic  Signature(s) Signed: 01/03/2018 4:15:03 PM By: Alejandro Mulling Entered By: Alejandro Mulling on 01/03/2018 09:50:36 Adam Fuller ([id number]) -------------------------------------------------------------------------------- Pain Assessment Details Patient Name: Adam Stabs L. Date of Service: 01/03/2018 9:30 AM Medical Record Number: [id number] Patient Account Number: [id number] Date of Birth/Sex: Oct 03, 1946 (72 y.o. M) Treating RN: Curtis Sites Primary Care Laelyn Blumenthal: Einar Crow Other Clinician: Referring Dalin Caldera: Einar Crow Treating Kirah Stice/Extender: Kathreen Cosier in Treatment: 2 Active Problems Location of Pain Severity and Description of Pain Patient Has Paino No Site Locations Pain Management and Medication Current Pain Management: Notes Topical or injectable lidocaine is offered to patient for acute pain when surgical debridement is performed. If needed, Patient is instructed to use over the counter pain medication for the following 24-48 hours after debridement. Wound care MDs do not prescribed pain medications. Patient has chronic pain or uncontrolled pain. Patient has been instructed to make an appointment with their Primary Care Physician for pain management. Electronic Signature(s) Signed: 01/03/2018 4:03:23 PM By: Curtis Sites Entered By: Curtis Sites on 01/03/2018 09:40:29 Adam Fuller ([id number]) -------------------------------------------------------------------------------- Patient/Caregiver Education Details Patient Name: Adam Fuller. Date of Service: 01/03/2018 9:30 AM Medical Record Number: [id number] Patient Account Number: [id number] Date of Birth/Gender: 1946-07-31 (72 y.o. M) Treating RN: Renne Crigler Primary Care Physician: Einar Crow Other Clinician: Referring Physician: Einar Crow Treating Physician/Extender: Kathreen Cosier in Treatment: 2 Education Assessment Education  Provided To: Patient Education Topics Provided Wound Debridement: Handouts: Wound Debridement Methods: Explain/Verbal Responses: State content correctly Wound/Skin Impairment: Handouts: Caring for Your Ulcer Methods: Explain/Verbal Responses: State content correctly Electronic Signature(s) Signed: 01/04/2018 4:26:13 PM By: Renne Crigler Entered By: Renne Crigler on 01/03/2018 10:09:33 Adam Fuller ([id number]) -------------------------------------------------------------------------------- Wound Assessment Details Patient Name: Domingo Sep, Tamari L. Date of Service: 01/03/2018 9:30 AM Medical Record Number: [id number] Patient Account Number: [id number] Date of Birth/Sex: [birth year] (72 y.o. M) Treating RN: Curtis Sites Primary Care Erianna Jolly: Einar Crow Other Clinician: Referring Travontae Freiberger: Einar Crow Treating Lanell Carpenter/Extender: Bonnell Public Weeks in Treatment: 2 Wound Status Wound Number: 1 Primary Trauma, Other Etiology: Wound Location: Right Forearm - Anterior Wound Status: Open Wounding Event: Trauma Comorbid Chronic Obstructive Pulmonary Disease Date Acquired: 12/17/2017 History: (COPD) Weeks Of Treatment: 2 Clustered Wound: No Photos Photo Uploaded By: Curtis Sites on 01/03/2018 09:55:17 Wound Measurements Length: (cm) 5.2 Width: (cm) 0.5 Depth: (cm) 0.1 Area: (cm) 2.042 Volume: (cm) 0.204 % Reduction in Area: 90% % Reduction in Volume: 90% Epithelialization: Medium (34-66%) Tunneling: No Undermining: No Wound Description Classification: Partial Thickness Wound Margin: Flat and Intact Exudate Amount: Medium Exudate Type: Serous Exudate Color: amber Foul Odor After Cleansing: No Slough/Fibrino Yes Wound Bed Granulation Amount: Large (67-100%) Exposed Structure Granulation Quality: Pink Fascia Exposed: No Necrotic Amount: Small (1-33%) Fat Layer (Subcutaneous Tissue) Exposed: Yes Necrotic Quality: Adherent Slough Tendon  Exposed: No Muscle Exposed: No Joint Exposed: No Bone Exposed: No Periwound Skin Texture Kent, Caliber L. ([id number]) Texture Color No Abnormalities Noted: No No Abnormalities Noted: No Callus: No Atrophie Blanche: No Crepitus: No Cyanosis: No Excoriation: No Ecchymosis: Yes Induration: No Erythema: No Rash: No Hemosiderin Staining: No Scarring: Yes Mottled: No Pallor: No Moisture Rubor: No No Abnormalities Noted: No Dry / Scaly: No Temperature / Pain Maceration: No Temperature: No Abnormality Tenderness on Palpation: Yes Wound Preparation Ulcer Cleansing: Rinsed/Irrigated with Saline Topical Anesthetic Applied: Other: lidocaine 4%, Treatment Notes Wound #1 (Right, Anterior Forearm) 1. Cleansed with: Clean wound with Normal Saline 2. Anesthetic Topical Lidocaine 4% cream to wound bed prior to debridement 4. Dressing Applied: Xeroform 5. Secondary Dressing Applied Non-Adherent pad Notes conform with tape cover with stretch net Electronic Signature(s) Signed: 01/03/2018 4:03:23 PM By: Curtis Sites Entered By: Curtis Sites on 01/03/2018 09:45:43 Knouff, Marolyn Hammock ([id number]) -------------------------------------------------------------------------------- Vitals Details Patient Name: Adam Stabs L. Date of Service: 01/03/2018 9:30 AM Medical Record Number: [id number] Patient Account Number: [id number] Date of Birth/Sex: 01/21/1946 (72 y.o. M) Treating RN: Curtis Sites Primary Care Lisandra Mathisen: Einar Crow Other Clinician: Referring Miata Culbreth: Einar Crow Treating Lakiah Dhingra/Extender: Kathreen Cosier in Treatment: 2 Vital Signs Time Taken: 09:40 Temperature (F): 97.9 Height (in): 71 Pulse (bpm): 88 Weight (lbs): 175 Respiratory Rate (breaths/min): 16 Body Mass Index (BMI): 24.4 Blood Pressure (mmHg): 137/85 Reference Range: 80 - 120 mg / dl Electronic Signature(s) Signed: 01/03/2018 4:03:23 PM By: Curtis Sites Entered By:  Curtis Sites on 01/03/2018 09:42:23

## 2018-01-10 ENCOUNTER — Encounter: Payer: Medicare Other | Attending: Nurse Practitioner | Admitting: Nurse Practitioner

## 2018-01-10 DIAGNOSIS — S51801S Unspecified open wound of right forearm, sequela: Secondary | ICD-10-CM | POA: Diagnosis not present

## 2018-01-10 DIAGNOSIS — J449 Chronic obstructive pulmonary disease, unspecified: Secondary | ICD-10-CM | POA: Diagnosis not present

## 2018-01-10 DIAGNOSIS — L98492 Non-pressure chronic ulcer of skin of other sites with fat layer exposed: Secondary | ICD-10-CM | POA: Diagnosis present
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
## 2018-01-12 NOTE — Progress Notes (Signed)
Adam Fuller, Adam Fuller. ([id number]) Visit Report for 01/10/2018 Arrival Information Details Patient Name: Adam Fuller, Adam Fuller. Date of Service: 01/10/2018 10:30 AM Medical Record Number: [id number] Patient Account Number: [id number] Date of Birth/Sex: 05/25/1946 (72 y.o. M) Treating RN: Adam Fuller Primary Care Adam Fuller: Einar Fuller, Adam Other Clinician: Referring Adam Fuller: Einar Fuller, Adam Treating Adam Fuller/Extender: Adam Fuller, Adam Fuller: 3 Visit Information History Since Last Visit Added or deleted any medications: No Patient Arrived: Ambulatory Any new allergies or adverse reactions: No Arrival Time: 10:39 Had a fall or experienced change in No Accompanied By: self activities of daily living that may affect Transfer Assistance: None risk of falls: Patient Identification Verified: Yes Signs or symptoms of abuse/neglect since last visito No Secondary Verification Process Completed: Yes Hospitalized since last visit: No Implantable device outside of the clinic excluding No cellular tissue based products placed in the center since last visit: Has Dressing in Place as Prescribed: Yes Pain Present Now: No Electronic Signature(s) Signed: 01/10/2018 4:24:22 PM By: Adam Fuller Entered By: Adam Fuller on 01/10/2018 10:40:11 Adam Fuller, Adam Fuller. ([id number]) -------------------------------------------------------------------------------- Clinic Level of Care Assessment Details Patient Name: Adam Fuller, Adam Fuller. Date of Service: 01/10/2018 10:30 AM Medical Record Number: [id number] Patient Account Number: [id number] Date of Birth/Sex: 06/28/1946 (72 y.o. M) Treating RN: Adam Fuller Primary Care Celenia Hruska: Einar Fuller, Adam Other Clinician: Referring Adam Fuller: Einar Fuller, Adam Treating Adam Fuller: Adam Fuller, Adam Fuller: 3 Clinic Level of Care Assessment Items TOOL 4 Quantity Score X - Use when only an EandM is performed on FOLLOW-UP visit 1  0 ASSESSMENTS - Nursing Assessment / Reassessment X - Reassessment of Co-morbidities (includes updates in patient status) 1 10 X- 1 5 Reassessment of Adherence to Fuller Plan ASSESSMENTS - Wound and Skin Assessment / Reassessment X - Simple Wound Assessment / Reassessment - one wound 1 5 []  - 0 Complex Wound Assessment / Reassessment - multiple wounds []  - 0 Dermatologic / Skin Assessment (not related to wound area) ASSESSMENTS - Focused Assessment []  - Circumferential Edema Measurements - multi extremities 0 []  - 0 Nutritional Assessment / Counseling / Intervention []  - 0 Lower Extremity Assessment (monofilament, tuning fork, pulses) []  - 0 Peripheral Arterial Disease Assessment (using hand held doppler) ASSESSMENTS - Ostomy and/or Continence Assessment and Care []  - Incontinence Assessment and Management 0 []  - 0 Ostomy Care Assessment and Management (repouching, etc.) PROCESS - Coordination of Care X - Simple Patient / Family Education for ongoing care 1 15 []  - 0 Complex (extensive) Patient / Family Education for ongoing care []  - 0 Staff obtains ChiropractorConsents, Records, Test Results / Process Orders []  - 0 Staff telephones HHA, Nursing Homes / Clarify orders / etc []  - 0 Routine Transfer to another Facility (non-emergent condition) []  - 0 Routine Hospital Admission (non-emergent condition) []  - 0 New Admissions / Manufacturing engineernsurance Authorizations / Ordering NPWT, Apligraf, etc. []  - 0 Emergency Hospital Admission (emergent condition) X- 1 10 Simple Discharge Coordination Adam Fuller, [name]. ([id number]) []  - 0 Complex (extensive) Discharge Coordination PROCESS - Special Needs []  - Pediatric / Minor Patient Management 0 []  - 0 Isolation Patient Management []  - 0 Hearing / Language / Visual special needs []  - 0 Assessment of Community assistance (transportation, D/C planning, etc.) []  - 0 Additional assistance / Altered mentation []  - 0 Support Surface(s) Assessment  (bed, cushion, seat, etc.) INTERVENTIONS - Wound Cleansing / Measurement X - Simple Wound Cleansing - one wound 1 5 []  - 0 Complex Wound Cleansing - multiple wounds X- 1 5 Wound Imaging (photographs -  any number of wounds) []  - 0 Wound Tracing (instead of photographs) []  - 0 Simple Wound Measurement - one wound []  - 0 Complex Wound Measurement - multiple wounds INTERVENTIONS - Wound Dressings []  - Small Wound Dressing one or multiple wounds 0 []  - 0 Medium Wound Dressing one or multiple wounds []  - 0 Large Wound Dressing one or multiple wounds []  - 0 Application of Medications - topical []  - 0 Application of Medications - injection INTERVENTIONS - Miscellaneous []  - External ear exam 0 []  - 0 Specimen Collection (cultures, biopsies, blood, body fluids, etc.) []  - 0 Specimen(s) / Culture(s) sent or taken to Lab for analysis []  - 0 Patient Transfer (multiple staff / Nurse, adult / Similar devices) []  - 0 Simple Staple / Suture removal (25 or less) []  - 0 Complex Staple / Suture removal (26 or more) []  - 0 Hypo / Hyperglycemic Management (close monitor of Blood Glucose) []  - 0 Ankle / Brachial Index (ABI) - do not check if billed separately X- 1 5 Vital Signs Adam Fuller, Adam Fuller. ([id number]) Has the patient been seen at the hospital within the last three years: Yes Total Score: 60 Level Of Care: New/Established - Level 2 Electronic Signature(s) Signed: 01/10/2018 4:32:30 PM By: Adam Fuller Entered By: Adam Fuller on 01/10/2018 12:42:54 Adam Fuller ([id number]) -------------------------------------------------------------------------------- Encounter Discharge Information Details Patient Name: Adam Fuller. Date of Service: 01/10/2018 10:30 AM Medical Record Number: [id number] Patient Account Number: [id number] Date of Birth/Sex: 20-Fuller-1947 (72 y.o. M) Treating RN: Adam Haggis Primary Care Makenzee Choudhry: Einar Crow Other Clinician: Referring  Masiyah Jorstad: Einar Crow Treating Vanden Fawaz/Extender: Adam Cosier in Fuller: 3 Encounter Discharge Information Items Discharge Pain Level: 0 Discharge Condition: Stable Ambulatory Status: Ambulatory Discharge Destination: Home Transportation: Private Auto Accompanied By: self Schedule Follow-up Appointment: No Medication Reconciliation completed and No provided to Patient/Care Cayde Held: Provided on Clinical Summary of Care: 01/10/2018 Form Type Recipient Paper Patient MB Electronic Signature(s) Signed: 01/11/2018 8:34:08 AM By: Gwenlyn Perking Entered By: Gwenlyn Perking on 01/10/2018 10:52:33 Adam Fuller ([id number]) -------------------------------------------------------------------------------- Lower Extremity Assessment Details Patient Name: Adam Fuller, Adam Fuller. Date of Service: 01/10/2018 10:30 AM Medical Record Number: [id number] Patient Account Number: [id number] Date of Birth/Sex: 1946-04-10 (72 y.o. M) Treating RN: Adam Sites Primary Care Loyalty Brashier: Einar Crow Other Clinician: Referring Ramzi Brathwaite: Einar Crow Treating Godwin Tedesco/Extender: Bonnell Public Weeks in Fuller: 3 Electronic Signature(s) Signed: 01/10/2018 4:24:22 PM By: Adam Sites Entered By: Adam Sites on 01/10/2018 10:43:38 Adam Fuller ([id number]) -------------------------------------------------------------------------------- Multi Wound Chart Details Patient Name: Adam Fuller, Adam Fuller. Date of Service: 01/10/2018 10:30 AM Medical Record Number: [id number] Patient Account Number: [id number] Date of Birth/Sex: 1946/03/25 (72 y.o. M) Treating RN: Adam Haggis Primary Care Marte Celani: Einar Crow Other Clinician: Referring Ivelise Castillo: Einar Crow Treating Insiya Oshea/Extender: Adam Cosier in Fuller: 3 Vital Signs Height(in): 71 Pulse(bpm): 79 Weight(lbs): 175 Blood Pressure(mmHg): 145/55 Body Mass Index(BMI):  24 Temperature(F): Respiratory Rate 18 (breaths/min): Photos: [1:No Photos] [N/A:N/A] Wound Location: [1:Right, Anterior Forearm] [N/A:N/A] Wounding Event: [1:Trauma] [N/A:N/A] Primary Etiology: [1:Trauma, Other] [N/A:N/A] Date Acquired: [1:12/17/2017] [N/A:N/A] Weeks of Fuller: [1:3] [N/A:N/A] Wound Status: [1:Healed - Epithelialized] [N/A:N/A] Measurements Fuller x W x D [1:0x0x0] [N/A:N/A] (cm) Area (cm) : [1:0] [N/A:N/A] Volume (cm) : [1:0] [N/A:N/A] % Reduction in Area: [1:100.00%] [N/A:N/A] % Reduction in Volume: [1:100.00%] [N/A:N/A] Classification: [1:Partial Thickness] [N/A:N/A] Periwound Skin Texture: [1:No Abnormalities Noted] [N/A:N/A] Periwound Skin Moisture: [1:No Abnormalities Noted] [N/A:N/A] Periwound Skin Color: [1:No Abnormalities Noted No] [N/A:N/A N/A] Fuller Notes Electronic Signature(s) Signed: 01/10/2018 11:03:54  AM By: Bonnell Public Entered By: Bonnell Public on 01/10/2018 11:03:54 Adam Fuller ([id number]) -------------------------------------------------------------------------------- Multi-Disciplinary Care Plan Details Patient Name: Adam Fuller, Adam Fuller. Date of Service: 01/10/2018 10:30 AM Medical Record Number: [id number] Patient Account Number: [id number] Date of Birth/Sex: Jan 07, 1946 (72 y.o. M) Treating RN: Adam Haggis Primary Care Camora Tremain: Einar Crow Other Clinician: Referring Albirtha Grinage: Einar Crow Treating Frances Joynt/Extender: Adam Cosier in Fuller: 3 Active Inactive Electronic Signature(s) Signed: 01/10/2018 4:32:30 PM By: Adam Fuller Entered By: Adam Fuller on 01/10/2018 10:50:56 Adam Fuller ([id number]) -------------------------------------------------------------------------------- Pain Assessment Details Patient Name: Adam Fuller, Adam Fuller. Date of Service: 01/10/2018 10:30 AM Medical Record Number: [id number] Patient Account Number: [id number] Date of Birth/Sex: 17-Jan-1946 (72 y.o.  M) Treating RN: Adam Sites Primary Care Baelyn Doring: Einar Crow Other Clinician: Referring Chasyn Cinque: Einar Crow Treating Yifan Auker/Extender: Adam Cosier in Fuller: 3 Active Problems Location of Pain Severity and Description of Pain Patient Has Paino No Site Locations Pain Management and Medication Current Pain Management: Electronic Signature(s) Signed: 01/10/2018 4:24:22 PM By: Adam Sites Entered By: Adam Sites on 01/10/2018 10:40:20 Adam Fuller ([id number]) -------------------------------------------------------------------------------- Patient/Caregiver Education Details Patient Name: Adam Fuller. Date of Service: 01/10/2018 10:30 AM Medical Record Number: [id number] Patient Account Number: [id number] Date of Birth/Gender: [birth year] (71 y.o. M) Treating RN: Adam Haggis Primary Care Physician: Einar Crow Other Clinician: Referring Physician: Einar Crow Treating Physician/Extender: Adam Cosier in Fuller: 3 Education Assessment Education Provided To: Patient Education Topics Provided Wound/Skin Impairment: Handouts: Other: Please call our office if you have any questions or concerns. Methods: Explain/Verbal Responses: State content correctly Electronic Signature(s) Signed: 01/10/2018 4:32:30 PM By: Adam Fuller Entered By: Adam Fuller on 01/10/2018 10:52:31 Adam Fuller ([id number]) -------------------------------------------------------------------------------- Wound Assessment Details Patient Name: Adam Fuller, Adam Fuller. Date of Service: 01/10/2018 10:30 AM Medical Record Number: [id number] Patient Account Number: [id number] Date of Birth/Sex: 12/30/1945 (72 y.o. M) Treating RN: Adam Sites Primary Care Tequia Wolman: Einar Crow Other Clinician: Referring Edoardo Laforte: Einar Crow Treating Nyheim Seufert/Extender: Adam Cosier in Fuller: 3 Wound Status Wound Number:  1 Primary Etiology: Trauma, Other Wound Location: Right, Anterior Forearm Wound Status: Healed - Epithelialized Wounding Event: Trauma Date Acquired: 12/17/2017 Weeks Of Fuller: 3 Clustered Wound: No Photos Photo Uploaded By: Adam Sites on 01/10/2018 11:54:16 Wound Measurements Length: (cm) 0 Width: (cm) 0 Depth: (cm) 0 Area: (cm) 0 Volume: (cm) 0 % Reduction in Area: 100% % Reduction in Volume: 100% Wound Description Classification: Partial Thickness Periwound Skin Texture Texture Color No Abnormalities Noted: No No Abnormalities Noted: No Moisture No Abnormalities Noted: No Electronic Signature(s) Signed: 01/10/2018 4:24:22 PM By: Adam Sites Entered By: Adam Sites on 01/10/2018 10:43:30 Adam Fuller ([id number]) -------------------------------------------------------------------------------- Vitals Details Patient Name: Adam Fuller. Date of Service: 01/10/2018 10:30 AM Medical Record Number: [id number] Patient Account Number: [id number] Date of Birth/Sex: 07/09/1946 (72 y.o. M) Treating RN: Adam Sites Primary Care Violett Hobbs: Einar Crow Other Clinician: Referring Sheddrick Lattanzio: Einar Crow Treating Henna Derderian/Extender: Adam Cosier in Fuller: 3 Vital Signs Time Taken: 10:40 Pulse (bpm): 79 Height (in): 71 Respiratory Rate (breaths/min): 18 Weight (lbs): 175 Blood Pressure (mmHg): 145/55 Body Mass Index (BMI): 24.4 Reference Range: 80 - 120 mg / dl Electronic Signature(s) Signed: 01/10/2018 4:24:22 PM By: Adam Sites Entered By: Adam Sites on 01/10/2018 10:41:50
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
## 2022-06-14 ENCOUNTER — Other Ambulatory Visit
Admission: RE | Admit: 2022-06-14 | Discharge: 2022-06-14 | Disposition: A | Discharge status: Home / Self Care | Payer: Medicare Other | Source: Ambulatory Visit | Attending: Family Medicine | Admitting: Family Medicine

## 2022-06-14 DIAGNOSIS — R200 Anesthesia of skin: Secondary | ICD-10-CM | POA: Insufficient documentation

## 2022-06-14 LAB — TROPONIN I (HIGH SENSITIVITY): Troponin I (High Sensitivity): 4 ng/L (ref <18)

## 2022-07-06 ENCOUNTER — Ambulatory Visit: Payer: Federal, State, Local not specified - PPO | Admitting: Internal Medicine

## 2023-04-16 ENCOUNTER — Emergency Department: Payer: Medicare Other

## 2023-04-16 ENCOUNTER — Other Ambulatory Visit: Payer: Self-pay

## 2023-04-16 ENCOUNTER — Emergency Department
Admission: EM | Admit: 2023-04-16 | Discharge: 2023-04-16 | Disposition: A | Discharge status: Home / Self Care | Payer: Medicare Other | Attending: Emergency Medicine | Admitting: Emergency Medicine

## 2023-04-16 DIAGNOSIS — S3992XA Unspecified injury of lower back, initial encounter: Secondary | ICD-10-CM | POA: Diagnosis present

## 2023-04-16 DIAGNOSIS — S32020A Wedge compression fracture of second lumbar vertebra, initial encounter for closed fracture: Secondary | ICD-10-CM | POA: Diagnosis not present

## 2023-04-16 DIAGNOSIS — W19XXXA Unspecified fall, initial encounter: Secondary | ICD-10-CM | POA: Diagnosis not present

## 2023-04-16 MED ORDER — DEXAMETHASONE SODIUM PHOSPHATE 10 MG/ML IJ SOLN
10.0000 mg | Freq: Once | INTRAMUSCULAR | Status: AC
  Administered 2023-04-16: 10 mg via INTRAMUSCULAR
  Filled 2023-04-16: qty 1

## 2023-04-16 MED ORDER — METHYLPREDNISOLONE 4 MG PO TBPK: ORAL_TABLET | ORAL | 0 refills | Status: DC

## 2023-04-16 MED ORDER — DIAZEPAM 2 MG PO TABS
2.0000 mg | ORAL_TABLET | Freq: Once | ORAL | Status: AC
  Administered 2023-04-16: 2 mg via ORAL
  Filled 2023-04-16: qty 1

## 2023-04-16 MED ORDER — MORPHINE SULFATE (PF) 4 MG/ML IV SOLN
4.0000 mg | Freq: Once | INTRAVENOUS | Status: AC
  Administered 2023-04-16: 4 mg via INTRAMUSCULAR
  Filled 2023-04-16: qty 1

## 2023-04-16 MED ORDER — OXYCODONE-ACETAMINOPHEN 5-325 MG PO TABS: 1.0000 | ORAL_TABLET | Freq: Four times a day (QID) | ORAL | 0 refills | Status: DC | PRN

## 2023-04-16 MED ORDER — ONDANSETRON 8 MG PO TBDP
8.0000 mg | ORAL_TABLET | Freq: Once | ORAL | Status: AC
  Administered 2023-04-16: 8 mg via ORAL
  Filled 2023-04-16: qty 1

## 2023-04-16 MED ORDER — DIAZEPAM 2 MG PO TABS: 2.0000 mg | ORAL_TABLET | Freq: Four times a day (QID) | ORAL | 0 refills | Status: DC | PRN

## 2023-04-16 NOTE — ED Triage Notes (Signed)
Pt presents to ED with c/o of back pain from a fall that happened 2 weeks ago. Pt denies incontinence. Pt states UC gave him pain meds and muscle relaxer and they are working. NAD noted. Pt ambulatory with steady gait.

## 2023-04-16 NOTE — ED Provider Triage Note (Signed)
Emergency Medicine Provider Triage Evaluation Note  Adam Fuller, a 77 y.o. male  was evaluated in triage.  Pt complains of mechanical fall 2 weeks ago. He fell onto the concrete patio, trying to sit on a rolling stool. He landed on his back, but denies head injury or LOC. He presented to [facility] 1 week ago for evaluation of ongoing pain and disability. Plain films were negative. RX Norco, prednisone, and muscle relaxant meds are not worling well. He denies bladder/bowel incontinence. Foot drop, or saddle anesthesia. History of lumbar  Fusion surgery.   Review of Systems  Positive: Right LBP Negative: LOC, weakness  Physical Exam  There were no vitals taken for this visit. Gen:   Awake, no distress  NAD3 Resp:  Normal effort CTA MSK:   Moves extremities without difficulty  Other:  LE DTRs 2+  Medical Decision Making  Medically screening exam initiated at 3:15 PM.  Appropriate orders placed.  Adam Fuller was informed that the remainder of the evaluation will be completed by another provider, this initial triage assessment does not replace that evaluation, and the importance of remaining in the ED until their evaluation is complete.  Patient to the ED for evaluation of persistent LBP following a mechanical fall 2 weeks ago.    Adam Hoard, PA-C 04/16/23 1523

## 2023-04-16 NOTE — ED Notes (Signed)
Back brace applied to pt by other staff

## 2023-04-16 NOTE — Progress Notes (Signed)
Orthopedic Tech Progress Note Patient Details:  Adam Fuller January 08, 1946 [id number]  Called in order to [facility] for a LSO   Patient ID: TERRIK SCARCE, male   DOB: 1945-11-15, 77 y.o.   MRN: [id number]  Donald Pore 04/16/2023, 6:03 PM

## 2023-04-16 NOTE — ED Notes (Signed)
Called Ortho for a Stat LSO brace.

## 2023-04-27 ENCOUNTER — Other Ambulatory Visit: Payer: Self-pay

## 2023-04-27 ENCOUNTER — Inpatient Hospital Stay
Admission: RE | Admit: 2023-04-27 | Discharge: 2023-04-27 | Disposition: A | Discharge status: Home / Self Care | Payer: Self-pay | Source: Ambulatory Visit | Attending: Neurosurgery | Admitting: Neurosurgery

## 2023-04-27 DIAGNOSIS — Z049 Encounter for examination and observation for unspecified reason: Secondary | ICD-10-CM

## 2023-04-30 NOTE — Progress Notes (Unsigned)
Referring Physician:  No referring provider defined for this encounter.  Primary Physician:  Adam Regulus, MD  History of Present Illness: 05/08/2023 [name] is here today with a chief complaint of back pain.  He had a fall approximately 3 weeks ago.  He was evaluated and found to have a compression fracture.  He presents today with continued pain with standing and walking.  Laying on his left side helps.  Heating pad is also helped.  He has no leg symptoms.  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  Bowel/Bladder Dysfunction: none  Conservative measures:  Physical therapy:  none Multimodal medical therapy including regular antiinflammatories:  Ibuprofen, Prednisone, Metaxalone, Hydrocodone Injections:  no epidural steroid injections  Past Surgery: 3 previous back surgeries  Adam Fuller has no symptoms of cervical myelopathy.  The symptoms are causing a significant impact on the patient's life.   I have utilized the care everywhere function in epic to review the outside records available from external health systems.  Review of Systems:  A 10 point review of systems is negative, except for the pertinent positives and negatives detailed in the HPI.  Past Medical History: History reviewed. No pertinent past medical history.  Past Surgical History: Past Surgical History:  Procedure Laterality Date   BACK SURGERY      Allergies: Allergies as of 05/08/2023 - Review Complete 05/08/2023  Allergen Reaction Noted   Varenicline Other (See Comments) 02/11/2013    Medications:  Current Outpatient Medications:    ibuprofen (ADVIL) 200 MG tablet, Take 200 mg by mouth every 6 (six) hours as needed., Disp: , Rfl:   Social History: Social History   Tobacco Use   Smoking status: Every Day   Smokeless tobacco: Never  Substance Use Topics   Alcohol use: Yes    Family Medical History: History reviewed. No pertinent family  history.  Physical Examination: Vitals:   05/08/23 1055  BP: 134/84    General: Patient is in no apparent distress. Attention to examination is appropriate.  Neck:   Supple.  Full range of motion.  Respiratory: Patient is breathing without any difficulty.   NEUROLOGICAL:     Awake, alert, oriented to person, place, and time.  Speech is clear and fluent.   Cranial Nerves: Pupils equal round and reactive to light.  Facial tone is symmetric.  Facial sensation is symmetric. Shoulder shrug is symmetric. Tongue protrusion is midline.  There is no pronator drift.  Strength: Side Biceps Triceps Deltoid Interossei Grip Wrist Ext. Wrist Flex.  R 5 5 5 5 5 5 5   L 5 5 5 5 5 5 5    Side Iliopsoas Quads Hamstring PF DF EHL  R 5 5 5 5 5 5   L 5 5 5 5 5 5    Reflexes are 1+ and symmetric at the biceps, triceps, brachioradialis, patella and achilles.   Hoffman's is absent.   Bilateral upper and lower extremity sensation is intact to light touch.    No evidence of dysmetria noted.  Gait is antalgic.   Medical Decision Making  Imaging: CT L 04/16/2023 IMPRESSION: 1. Acute superior endplate compression deformity at L2. 2. Status post L4-S1 spinal fusion. Hardware is intact. No evidence of perihardware lucency to suggest hardware failure.     Electronically Signed   By: Lorenza Cambridge M.D.   On: 04/16/2023 16:00  I have personally reviewed the images and agree with the above interpretation.  Assessment and Plan: Mr.  Selman is a pleasant 77 y.o. male with L2 compression fracture.  He is still early in the course of this.  He will come back to clinic in approximately 4 weeks.  He will get x-rays at that time.  I encouraged him to discontinue smoking.  I will send him a medications for pain.  I did let him know that we would refill his pain medications for the next 2 months until he is cleared from his fracture.  When he comes back in 4 weeks, he can be considered for kyphoplasty if his  pain is not improving.    Thank you for involving me in the care of this patient.      Mayson Sterbenz K. Myer Haff MD, [facility]surgery

## 2023-04-30 NOTE — ED Provider Notes (Signed)
[facility] Provider Note  Patient Contact: 7: 46 PM (approximate)   History   Back Pain   HPI  Adam Fuller is a 77 y.o. male who presents the emergency department with a complaint of back pain.  Patient had a mechanical fall fall roughly 2 weeks ago.  He was trying to sit on the stool, missed and landed on his back.  He was having ongoing pain to his lower back.  Reportedly and had plain films at urgent care, had been prescribed medications with no significant improvement of symptoms.  Denied bowel or bladder dysfunction, saddle anesthesia or paresthesia.  Does have a history of a previous lumbar surgery.  In this injury patient denied any other musculoskeletal injury, did not hit his head or lose consciousness.  He has no other complaints at this time.     Physical Exam   Triage Vital Signs: ED Triage Vitals  Encounter Vitals Group     BP 04/16/23 1517 (!) 157/95     Systolic BP Percentile --      Diastolic BP Percentile --      Pulse Rate 04/16/23 1517 (!) 52     Resp 04/16/23 1517 18     Temp 04/16/23 1517 97.6 F (36.4 C)     Temp Source 04/16/23 1517 Oral     SpO2 04/16/23 1517 98 %     Weight --      Height --      Head Circumference --      Peak Flow --      Pain Score 04/16/23 1518 7     Pain Loc --      Pain Education --      Exclude from Growth Chart --     Most recent vital signs: Vitals:   04/16/23 1517 04/16/23 1931  BP: (!) 157/95 132/75  Pulse: (!) 52 82  Resp: 18 17  Temp: 97.6 F (36.4 C) (!) 97.5 F (36.4 C)  SpO2: 98% 93%     General: Alert and in no acute distress.  Cardiovascular:  Good peripheral perfusion Respiratory: Normal respiratory effort without tachypnea or retractions. Lungs CTAB.  Musculoskeletal: Full range of motion to all extremities.  Tenderness in the lower lumbar spine point specific over L2-L3 region.  Patient with no palpable abnormality or step-off.  No extension into the sciatic  notches.  There is also pedis pulses sensation intact and equal bilateral lower extremities. Neurologic:  No gross focal neurologic deficits are appreciated.  Skin:   No rash noted Other:   ED Results / Procedures / Treatments   Labs (all labs ordered are listed, but only abnormal results are displayed) Labs Reviewed - No data to display   EKG    RADIOLOGY  I personally viewed, evaluated, and interpreted these images as part of my medical decision making, as well as reviewing the written report by the radiologist.  ED Provider Interpretation: Findings on CT scan reveals findings of compression fracture at L2.  Previous L4-S1 spinal fusion, no hardware injury.  No results found.  PROCEDURES:  Critical Care performed: No  Procedures   MEDICATIONS ORDERED IN ED: Medications  ondansetron (ZOFRAN-ODT) disintegrating tablet 8 mg (8 mg Oral Given 04/16/23 1740)  morphine (PF) 4 MG/ML injection 4 mg (4 mg Intramuscular Given 04/16/23 1743)  dexamethasone (DECADRON) injection 10 mg (10 mg Intramuscular Given 04/16/23 1741)  diazepam (VALIUM) tablet 2 mg (2 mg Oral Given 04/16/23 1740)  IMPRESSION / MDM / ASSESSMENT AND PLAN / ED COURSE  I reviewed the triage vital signs and the nursing notes.                                 Differential diagnosis includes, but is not limited to, low back pain, lumbar strain, lumbar contusion, compression fracture   Patient's presentation is most consistent with acute presentation with potential threat to life or bodily function.   Patient's diagnosis is consistent with L2 compression fracture.  Patient presents emergency department after mechanical fall.  He was attempting to sit down the stool when he missed and landed on his back.  Symptoms x 2 weeks.  Patient is still ambulatory with no concerning neurodeficits.  Patient had tenderness over the spine and given the negative outpatient x-rays I proceeded with CT scan.  There is a mild endplate  compression deformity on L2 consistent with patient's injury.  Patient is given LSO brace and referred to neuro surgery.  Symptom control medications prescribed for the patient.  Concerning signs and symptoms and return precautions discussed with the patient.  Patient is stable for discharge at this time.   Patient is given ED precautions to return to the ED for any worsening or new symptoms.     FINAL CLINICAL IMPRESSION(S) / ED DIAGNOSES   Final diagnoses:  Compression fracture of L2 vertebra, initial encounter (HCC)     Rx / DC Orders   ED Discharge Orders          Ordered    methylPREDNISolone (MEDROL DOSEPAK) 4 MG TBPK tablet        04/16/23 1939    diazepam (VALIUM) 2 MG tablet  Every 6 hours PRN        04/16/23 1939    oxyCODONE-acetaminophen (PERCOCET/ROXICET) 5-325 MG tablet  Every 6 hours PRN        04/16/23 1939             Note:  This document was prepared using Dragon voice recognition software and may include unintentional dictation errors.   Lanette Hampshire 04/30/23 2316    Jene Every, MD 05/03/23 [phone]

## 2023-05-08 ENCOUNTER — Encounter: Payer: Self-pay | Admitting: Neurosurgery

## 2023-05-08 ENCOUNTER — Ambulatory Visit (INDEPENDENT_AMBULATORY_CARE_PROVIDER_SITE_OTHER): Payer: Medicare Other | Admitting: Neurosurgery

## 2023-05-08 VITALS — BP 134/84 | Ht 71.0 in | Wt 142.0 lb

## 2023-05-08 DIAGNOSIS — S32020A Wedge compression fracture of second lumbar vertebra, initial encounter for closed fracture: Secondary | ICD-10-CM

## 2023-05-08 DIAGNOSIS — W19XXXA Unspecified fall, initial encounter: Secondary | ICD-10-CM | POA: Diagnosis not present

## 2023-05-08 MED ORDER — CELECOXIB 200 MG PO CAPS: 200.0000 mg | ORAL_CAPSULE | Freq: Every day | ORAL | 0 refills | Status: AC

## 2023-05-08 MED ORDER — METHOCARBAMOL 500 MG PO TABS: 500.0000 mg | ORAL_TABLET | Freq: Four times a day (QID) | ORAL | 0 refills | Status: DC | PRN

## 2023-05-08 MED ORDER — OXYCODONE HCL 5 MG PO TABS: 5.0000 mg | ORAL_TABLET | ORAL | 0 refills | Status: AC | PRN

## 2023-05-16 ENCOUNTER — Other Ambulatory Visit: Payer: Self-pay | Admitting: Neurosurgery

## 2023-05-16 ENCOUNTER — Telehealth: Payer: Self-pay | Admitting: Neurosurgery

## 2023-05-16 MED ORDER — OXYCODONE HCL 5 MG PO TABS: 5.0000 mg | ORAL_TABLET | Freq: Four times a day (QID) | ORAL | 0 refills | Status: AC | PRN

## 2023-05-16 NOTE — Telephone Encounter (Signed)
Pt was given oxycodone- acetaminophen (PERCOCET/ROXICET) 5-325MG  tablet while in the ED on 7/8. During his appt with Dr. Jeannie Fend on 7/30, he was told "I will send him a medications for pain. I did let him know that we would refill his pain medications for the next 2 months until he is cleared from his fracture. "  Pt is calling in asking for a refill of the oxycodone Please send refill to [facility]

## 2023-05-17 ENCOUNTER — Other Ambulatory Visit: Payer: Self-pay | Admitting: Internal Medicine

## 2023-05-17 DIAGNOSIS — J42 Unspecified chronic bronchitis: Secondary | ICD-10-CM

## 2023-05-17 DIAGNOSIS — F17210 Nicotine dependence, cigarettes, uncomplicated: Secondary | ICD-10-CM

## 2023-05-17 DIAGNOSIS — Z720 Tobacco use: Secondary | ICD-10-CM

## 2023-05-23 ENCOUNTER — Ambulatory Visit
Admission: RE | Admit: 2023-05-23 | Discharge: 2023-05-23 | Disposition: A | Discharge status: Home / Self Care | Payer: Medicare Other | Source: Ambulatory Visit | Attending: Internal Medicine | Admitting: Internal Medicine

## 2023-05-23 DIAGNOSIS — J42 Unspecified chronic bronchitis: Secondary | ICD-10-CM | POA: Insufficient documentation

## 2023-05-23 DIAGNOSIS — F17210 Nicotine dependence, cigarettes, uncomplicated: Secondary | ICD-10-CM | POA: Insufficient documentation

## 2023-05-23 DIAGNOSIS — Z720 Tobacco use: Secondary | ICD-10-CM | POA: Insufficient documentation

## 2023-06-05 ENCOUNTER — Other Ambulatory Visit: Payer: Self-pay | Admitting: Orthopedic Surgery

## 2023-06-05 ENCOUNTER — Ambulatory Visit
Admission: RE | Admit: 2023-06-05 | Discharge: 2023-06-05 | Disposition: A | Discharge status: Home / Self Care | Payer: Medicare Other | Source: Ambulatory Visit | Attending: Orthopedic Surgery | Admitting: Orthopedic Surgery

## 2023-06-05 DIAGNOSIS — S32020A Wedge compression fracture of second lumbar vertebra, initial encounter for closed fracture: Secondary | ICD-10-CM

## 2023-06-05 NOTE — Progress Notes (Unsigned)
Referring Physician:  No referring provider defined for this encounter.  Primary Physician:  Lauro Regulus, MD  History of Present Illness: 06/06/2023 Mr. Clyde Godbolt has a history of chronic bronchitis.   History of L4-S1 spinal fusion.   Last seen by Dr. Myer Haff on 05/08/23 for L2 compression fracture s/p fall on around 04/02/23.   He is here for follow up and repeat xrays.   He has intermittent back pain with no leg pain. He can lay in his right side without pain. He still has pain with prolonged standing and walking. He still is very limited in his activity due to pain. No numbness, tingling, or weakness.   Bowel/Bladder Dysfunction: none  Conservative measures:  Physical therapy:  none Multimodal medical therapy including regular antiinflammatories:  Ibuprofen, Prednisone, Metaxalone, Hydrocodone Injections:  no epidural steroid injections  Past Surgery: 3 previous back surgeries  [name] has no symptoms of cervical myelopathy.  The symptoms are causing a significant impact on the patient's life.   Review of Systems:  A 10 point review of systems is negative, except for the pertinent positives and negatives detailed in the HPI.  Past Medical History: History reviewed. No pertinent past medical history.  Past Surgical History: Past Surgical History:  Procedure Laterality Date   BACK SURGERY      Allergies: Allergies as of 06/06/2023 - Review Complete 06/06/2023  Allergen Reaction Noted   Varenicline Other (See Comments) 02/11/2013    Medications:  Current Outpatient Medications:    albuterol (VENTOLIN HFA) 108 (90 Base) MCG/ACT inhaler, Inhale 1 puff into the lungs every 4 (four) hours as needed., Disp: , Rfl:    buPROPion (WELLBUTRIN XL) 150 MG 24 hr tablet, Take 150 mg by mouth daily., Disp: , Rfl:    buPROPion (WELLBUTRIN XL) 300 MG 24 hr tablet, Take 300 mg by mouth daily., Disp: , Rfl:    celecoxib (CELEBREX) 200 MG capsule,  Take 1 capsule (200 mg total) by mouth daily., Disp: 60 capsule, Rfl: 0   ibuprofen (ADVIL) 200 MG tablet, Take 200 mg by mouth every 6 (six) hours as needed., Disp: , Rfl:    methocarbamol (ROBAXIN) 500 MG tablet, Take 1 tablet (500 mg total) by mouth every 6 (six) hours as needed for muscle spasms., Disp: 120 tablet, Rfl: 0  Social History: Social History   Tobacco Use   Smoking status: Every Day   Smokeless tobacco: Never  Substance Use Topics   Alcohol use: Yes    Family Medical History: History reviewed. No pertinent family history.  Physical Examination: Vitals:   06/06/23 0943  BP: 138/80      Awake, alert, oriented to person, place, and time.  Speech is clear and fluent.   Cranial Nerves: Pupils equal round and reactive to light.  Facial tone is symmetric.  Facial sensation is symmetric.   Strength: Side Biceps Triceps Deltoid Interossei Grip Wrist Ext. Wrist Flex.  R 5 5 5 5 5 5 5   L 5 5 5 5 5 5 5    Side Iliopsoas Quads Hamstring PF DF EHL  R 5 5 5 5 5 5   L 5 5 5 5 5 5    Reflexes are 1+ and symmetric at the biceps, brachioradialis, patella and achilles.   Hoffman's is absent.   Bilateral upper and lower extremity sensation is intact to light touch.     Gait is antalgic. He uses a cane.    Medical Decision Making  Imaging: Xrays of  lumbar spine dated 06/05/23:  L2 fracture show some progression of fracture from lumbar CT 04/16/23. Fracture is stable compared to CT chest on 05/23/23.  Radiology report for above xrays not yet available.   Assessment and Plan: Mr. Ledezma is a pleasant 77 y.o. male with L2 compression fracture s/p fall on around 04/02/23.   He still has significant LBP and is limited in his activities. He has no leg pain. No numbness, tingling, or weakness.   Xrays show some progression of L2 fracture from lumbar CT on 04/16/23. No progression from chest CT on 05/23/23.   Treatment options reviewed with patient and following plan made:   -  Dr. Myer Haff discussed kyphoplasty with him at his visit and he is interested in pursuing this.  - MRI of lumbar spine ordered to evaluate L2 compression fracture.  - DEXA scan ordered as well. He will call to schedule.  - He will continue to wear his LSO brace. No bending, twisting, or lifting.  - Will call him with above results and likely refer him to IR for kyphoplasty  I spent a total of 25 minutes in face-to-face and non-face-to-face activities related to this patient's care today including review of outside records, review of imaging, review of symptoms, physical exam, discussion of differential diagnosis, discussion of treatment options, and documentation.   Drake Leach PA-C Neurosurgery

## 2023-06-06 ENCOUNTER — Ambulatory Visit (INDEPENDENT_AMBULATORY_CARE_PROVIDER_SITE_OTHER): Payer: Medicare Other | Admitting: Orthopedic Surgery

## 2023-06-06 ENCOUNTER — Encounter: Payer: Self-pay | Admitting: Orthopedic Surgery

## 2023-06-06 VITALS — BP 138/80 | Ht 71.0 in | Wt 145.0 lb

## 2023-06-06 DIAGNOSIS — W19XXXD Unspecified fall, subsequent encounter: Secondary | ICD-10-CM

## 2023-06-06 DIAGNOSIS — M4856XA Collapsed vertebra, not elsewhere classified, lumbar region, initial encounter for fracture: Secondary | ICD-10-CM

## 2023-06-06 DIAGNOSIS — S32020D Wedge compression fracture of second lumbar vertebra, subsequent encounter for fracture with routine healing: Secondary | ICD-10-CM

## 2023-06-06 NOTE — Patient Instructions (Signed)
It was so nice to see you today. Thank you so much for coming in.    I want to get an MRI of your lumbar spine to look into things further. We will get this approved through your insurance and [facility] will call you to schedule the appointment.   [facility] (building with the white pillars) is located off of [address]. The address is [address] [place], [place] [address].   Call the [facility] to schedule your DEXA (bone density) test. [phone]  Continue to wear your brace. No bending, twisting, or lifting.   After you have the MRI, it takes 57-10 days for me to get the results back. Once I have them, I will call you.  Please do not hesitate to call if you have any questions or concerns. You can also message me in MyChart.   Drake Leach PA-C [phone]

## 2023-06-08 ENCOUNTER — Telehealth: Payer: Self-pay | Admitting: Orthopedic Surgery

## 2023-06-08 DIAGNOSIS — S32020D Wedge compression fracture of second lumbar vertebra, subsequent encounter for fracture with routine healing: Secondary | ICD-10-CM

## 2023-06-08 MED ORDER — OXYCODONE HCL 5 MG PO TABS
5.0000 mg | ORAL_TABLET | Freq: Three times a day (TID) | ORAL | 0 refills | Status: DC | PRN
Start: 2023-06-08 — End: 2023-06-20

## 2023-06-08 NOTE — Telephone Encounter (Signed)
Patient's wife calling Lura Em is calling on behalf of the patient. He is out of the oxycodone that was prescribed at the ER. He is still in a lot of pain. His MRI is not scheduled until 9/12. Can he get some kind of medication to help with the pain? [facility] [address].

## 2023-06-08 NOTE — Telephone Encounter (Signed)
Treating him for L2 compression fracture. Getting MRI, DEXA and likely referring him to IR for kyphoplasty.   PMP reviewed and is appropriate. Refill for oxycodone sent to pharmacy. Directions changed to q 8 hours prn.   Please let him know script was sent to pharmacy.

## 2023-06-08 NOTE — Telephone Encounter (Signed)
Patient notified of refill.

## 2023-06-20 ENCOUNTER — Telehealth: Payer: Self-pay | Admitting: Orthopedic Surgery

## 2023-06-20 DIAGNOSIS — S32020D Wedge compression fracture of second lumbar vertebra, subsequent encounter for fracture with routine healing: Secondary | ICD-10-CM

## 2023-06-20 MED ORDER — OXYCODONE HCL 5 MG PO TABS
5.0000 mg | ORAL_TABLET | Freq: Three times a day (TID) | ORAL | 0 refills | Status: DC | PRN
Start: 2023-06-20 — End: 2023-06-28

## 2023-06-20 NOTE — Telephone Encounter (Signed)
Treating him for L2 compression fracture. Getting MRI, DEXA and likely referring him to IR for kyphoplasty.    PMP reviewed and is appropriate. Refill for oxycodone sent to pharmacy. Please let him know.

## 2023-06-20 NOTE — Telephone Encounter (Signed)
Patient aware of medication refill.

## 2023-06-20 NOTE — Telephone Encounter (Signed)
  Media Information   Document Information  Patient called after hours to request refill

## 2023-06-21 ENCOUNTER — Ambulatory Visit
Admission: RE | Admit: 2023-06-21 | Discharge: 2023-06-21 | Disposition: A | Discharge status: Home / Self Care | Payer: Medicare Other | Source: Ambulatory Visit | Attending: Orthopedic Surgery | Admitting: Orthopedic Surgery

## 2023-06-21 DIAGNOSIS — S32020D Wedge compression fracture of second lumbar vertebra, subsequent encounter for fracture with routine healing: Secondary | ICD-10-CM | POA: Diagnosis present

## 2023-06-28 ENCOUNTER — Telehealth: Payer: Self-pay | Admitting: Orthopedic Surgery

## 2023-06-28 DIAGNOSIS — S32020D Wedge compression fracture of second lumbar vertebra, subsequent encounter for fracture with routine healing: Secondary | ICD-10-CM

## 2023-06-28 MED ORDER — OXYCODONE HCL 5 MG PO TABS: 5.0000 mg | ORAL_TABLET | Freq: Three times a day (TID) | ORAL | 0 refills | Status: DC | PRN

## 2023-06-28 MED ORDER — METHOCARBAMOL 500 MG PO TABS: 500.0000 mg | ORAL_TABLET | Freq: Four times a day (QID) | ORAL | 0 refills | Status: DC | PRN

## 2023-06-28 NOTE — Telephone Encounter (Signed)
Oxycodone 5mg  every 6 hours Methocarbamol 500mg  every 6 hours [facility] on [address]

## 2023-06-28 NOTE — Telephone Encounter (Signed)
Treating him for L2 compression fracture. Getting MRI, DEXA and likely referring him to IR for kyphoplasty.    PMP reviewed and is appropriate.   Oxycodone and robaxin sent to pharmacy. Please let him know.

## 2023-06-28 NOTE — Telephone Encounter (Signed)
Patient notified of medication refills.

## 2023-07-09 ENCOUNTER — Telehealth: Payer: Self-pay | Admitting: Orthopedic Surgery

## 2023-07-09 DIAGNOSIS — S32020D Wedge compression fracture of second lumbar vertebra, subsequent encounter for fracture with routine healing: Secondary | ICD-10-CM

## 2023-07-09 MED ORDER — OXYCODONE HCL 5 MG PO TABS: 5.0000 mg | ORAL_TABLET | Freq: Three times a day (TID) | ORAL | 0 refills | Status: DC | PRN

## 2023-07-09 NOTE — Telephone Encounter (Signed)
Oxycodone *same as last request* [facility] [address]

## 2023-07-09 NOTE — Progress Notes (Unsigned)
Telephone Visit- Progress Note: Referring Physician:  Lauro Regulus, MD [address] [facility] [address] [place],  [place] [address]  Primary Physician:  Lauro Regulus, MD  This visit was performed via telephone.  Patient location: home Provider location: office  I spent a total of 10 minutes non-face-to-face activities for this visit on the date of this encounter including review of current clinical condition and response to treatment.    Patient has given verbal consent to this telephone visits and we reviewed the limitations of a telephone visit. Patient wishes to proceed.    Chief Complaint:  review MRI results  History of Present Illness: Adam Fuller is a 77 y.o. male has a history of  chronic bronchitis.    History of L4-S1 spinal fusion.   Last seen by me on 06/06/23 for follow up of L2 compression fracture s/p fall on around 04/02/23.   He was still having significant pain, so MRI and DEXA scan were ordered.   Phone visit scheduled to review MRI results. He is scheduled to have DEXA scan done today.   He continues with constant LBP with no leg pain. Pain is worse with standing or walking. He is limited due to pain. No numbness, tingling, or weakness.   He continues on prn oxycodone for pain.    Bowel/Bladder Dysfunction: none   Conservative measures:  Physical therapy:  none Multimodal medical therapy including regular antiinflammatories:  Ibuprofen, Prednisone, Metaxalone, Hydrocodone Injections:  no epidural steroid injections   Past Surgery: 3 previous back surgeries   Adam Fuller has no symptoms of cervical myelopathy.   The symptoms are causing a significant impact on the patient's life.   Exam: No exam done as this was a telephone encounter.     Imaging: MRI of lumbar spine dated 06/21/23:  FINDINGS: Segmentation:  Standard.   Alignment:  Mild levocurvature   Vertebrae: Subacute L2 compression fracture  with 40% height loss when compared to L1. There is posterior cortex involvement with mild buckling near the superior posterior corner. Mild marrow edema at the right L1-2 facet. No retropulsion or aggressive bone lesion. Some reactive focal edematous signal is seen in the L1 posterior and right-sided endplate where there is osteophyte and subchondral cyst.   Remote L3 superior endplate/Schmorl's node.   L4-S1 fusion with solid arthrodesis.   Conus medullaris and cauda equina: Conus extends to the L1-2 level. Conus and cauda equina appear normal.   Paraspinal and other soft tissues: Dilated infrarenal abdominal aorta measuring 3 cm in diameter. Sigmoid diverticulosis.   Disc levels:   T12- L1: Unremarkable.   L1-L2: Disc narrowing and bulging with right foraminal protrusion and facet spurring. Moderate right foraminal stenosis   L2-L3: Mild disc desiccation and narrowing.   L3-L4: Disc height loss with biforaminal protrusion. Facet spurring on both sides. Moderate spinal stenosis. Patent foramina   L4-L5: PLIF.  No impingement   L5-S1:PLIF.  No impingement   IMPRESSION: 1. Subacute L2 compression fracture or with 40% height loss, mildly progressed from 04/16/2023 CT. Reactive edematous signal at the L1 inferior endplate and right L1-2 facet. 2. L4-S1 solid arthrodesis. 3. 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 4. Infrarenal abdominal aortic aneurysm measuring 3 cm in diameter. Recommend follow-up ultrasound every 3 years. This recommendation follows ACR consensus guidelines: White Paper of the ACR Incidental Findings Committee II on Vascular Findings. J Am Coll Radiol 2013; 10:789-794.     Electronically Signed  By: Tiburcio Pea M.D.   On: 07/09/2023 06:10  I have personally reviewed the images and agree with the above interpretation.  Assessment and Plan: [name] is a pleasant 77 y.o. male with  L2 compression fracture s/p  fall on around 04/02/23.    He still has significant constant LBP and he continues to be limited in his activities. He has no leg pain. No numbness, tingling, or weakness.   MRI shows subacute compression fracature of L2. Fusion L4-S1 and moderate central stenosis L3-L4.   Note also made of infrarenal abdominal aortic aneurysm measuring 3 cm in diameter.    Treatment options reviewed with patient and following plan made:    - He is scheduled for DEXA scan today. Will call him with results.  - Continue with brace. No bending, twisting, or lifting.  - Referral to IR to discuss possible kyphoplasty at L2.  - Discussed finding of AAA on lumbar MRI with patient along with recommendation to have [place] every 3 years. Message sent to his PCP and he will follow up with this.  - Continue prn oxycodone. Will plan to wean this after he has kyphoplasty.   DEXA scan dated 07/11/23 is reviewed and shows osteoporosis. Called patient and let him know. Message to PCP to see if he wanted to treat or if he wanted me to refer him to endocrine.   Drake Leach PA-C Neurosurgery

## 2023-07-09 NOTE — Telephone Encounter (Signed)
Refill of oxycodone sent to pharmacy. Please let him know.    PMP is reviewed and is appropriate.

## 2023-07-09 NOTE — Telephone Encounter (Signed)
Patient notified and expressed understanding.

## 2023-07-11 ENCOUNTER — Ambulatory Visit
Admission: RE | Admit: 2023-07-11 | Discharge: 2023-07-11 | Disposition: A | Discharge status: Home / Self Care | Payer: Medicare Other | Source: Ambulatory Visit | Attending: Orthopedic Surgery | Admitting: Orthopedic Surgery

## 2023-07-11 ENCOUNTER — Ambulatory Visit (INDEPENDENT_AMBULATORY_CARE_PROVIDER_SITE_OTHER): Payer: Medicare Other | Admitting: Orthopedic Surgery

## 2023-07-11 ENCOUNTER — Encounter: Payer: Self-pay | Admitting: Orthopedic Surgery

## 2023-07-11 DIAGNOSIS — S32020D Wedge compression fracture of second lumbar vertebra, subsequent encounter for fracture with routine healing: Secondary | ICD-10-CM

## 2023-07-11 DIAGNOSIS — M4856XA Collapsed vertebra, not elsewhere classified, lumbar region, initial encounter for fracture: Secondary | ICD-10-CM | POA: Diagnosis present

## 2023-07-11 DIAGNOSIS — W19XXXD Unspecified fall, subsequent encounter: Secondary | ICD-10-CM

## 2023-07-13 ENCOUNTER — Ambulatory Visit
Admission: RE | Admit: 2023-07-13 | Discharge: 2023-07-13 | Disposition: A | Discharge status: Home / Self Care | Payer: Medicare Other | Source: Ambulatory Visit | Attending: Orthopedic Surgery | Admitting: Orthopedic Surgery

## 2023-07-13 DIAGNOSIS — S32020D Wedge compression fracture of second lumbar vertebra, subsequent encounter for fracture with routine healing: Secondary | ICD-10-CM

## 2023-07-13 DIAGNOSIS — S32020A Wedge compression fracture of second lumbar vertebra, initial encounter for closed fracture: Secondary | ICD-10-CM

## 2023-07-13 HISTORY — PX: IR RADIOLOGIST EVAL & MGMT: IMG5224

## 2023-07-13 NOTE — Consult Note (Signed)
Chief Complaint: Patient was seen in consultation today for possible L2 kyphoplasty.  at the request of Luna,Stacy  Referring Physician(s): Luna,Stacy  History of Present Illness: Adam Fuller is a 77 y.o. male with recent onset of lower back pain after a fall in early July 2024. He was diagnosed with an L2 compression fracture and started on conservative therapy with opioid pain medication and a TLSO after seeing Dr. Marcell Barlow. At subsequent follow-up, he had persistent pain. Today, he rates the pain is 8/10 at its worst, coming down to 3-4/10 with oral pain medications, depending on his level of activity. The pain significantly limits his activities. He scored 23/24 points on the Roland-Morris Disability Index. He denies any radiating pain to his hips or lower extremities.  No past medical history on file.  Past Surgical History:  Procedure Laterality Date   BACK SURGERY      Allergies: Varenicline  Medications: Prior to Admission medications   Medication Sig Start Date End Date Taking? Authorizing Provider  buPROPion (WELLBUTRIN XL) 150 MG 24 hr tablet Take 150 mg by mouth daily. 05/15/23 05/14/24 Yes [provider]  buPROPion (WELLBUTRIN XL) 300 MG 24 hr tablet Take 300 mg by mouth daily. 05/15/23 05/14/24 Yes [provider]  ibuprofen (ADVIL) 200 MG tablet Take 200 mg by mouth every 6 (six) hours as needed.   Yes [provider]  methocarbamol (ROBAXIN) 500 MG tablet Take 1 tablet (500 mg total) by mouth every 6 (six) hours as needed for muscle spasms. 06/28/23  Yes Drake Leach, PA-C  oxyCODONE (ROXICODONE) 5 MG immediate release tablet Take 1 tablet (5 mg total) by mouth every 8 (eight) hours as needed for severe pain. 07/09/23  Yes Drake Leach, PA-C  albuterol (VENTOLIN HFA) 108 (90 Base) MCG/ACT inhaler Inhale 1 puff into the lungs every 4 (four) hours as needed. Patient not taking: Reported on 07/13/2023 05/29/23 05/28/24  [provider]      No family history on file.  Social History   Socioeconomic History   Marital status: Married    Spouse name: Not on file   Number of children: Not on file   Years of education: Not on file   Highest education level: Not on file  Occupational History   Not on file  Tobacco Use   Smoking status: Every Day   Smokeless tobacco: Never  Substance and Sexual Activity   Alcohol use: Yes   Drug use: Not on file   Sexual activity: Not on file  Other Topics Concern   Not on file  Social History Narrative   Not on file   Social Determinants of Health   Financial Resource Strain: Low Risk  (05/15/2023)   Received from [facility]   Overall Financial Resource Strain (CARDIA)    Difficulty of Paying Living Expenses: Not hard at all  Food Insecurity: No Food Insecurity (05/15/2023)   Received from [facility]   Hunger Vital Sign    Worried About Running Out of Food in the Last Year: Never true    Ran Out of Food in the Last Year: Never true  Transportation Needs: No Transportation Needs (05/15/2023)   Received from [facility]    In the past 12 months, has lack of transportation kept you from medical appointments or from getting medications?: No    Lack of Transportation (Non-Medical): No  Physical Activity: Not on file  Stress: Not on  file  Social Connections: Not on file    ECOG Status: 2 - Symptomatic, <50% confined to bed  Review of Systems: A 12 point ROS discussed and pertinent positives are indicated in the HPI above.  All other systems are negative.  Review of Systems  Vital Signs: BP (!) 180/84 (BP Location: Left Arm, Patient Position: Sitting, Cuff Size: Normal)   Pulse 87   Temp 98.1 F (36.7 C) (Oral)   Resp 16   SpO2 98%   Physical Exam  General: Well appearing, no acute distress. CV: regular rate & rhythm Respiratory: clear to auscultation bilaterally Abd: Soft,  nontender Back: focal tenderness to palpation along the L2 spinous process, endorses reproduction of typical pain.  Imaging: DG Bone Density  Result Date: 07/11/2023 EXAM: DUAL X-RAY ABSORPTIOMETRY (DXA) FOR BONE MINERAL DENSITY IMPRESSION: Your patient Adam Fuller completed a BMD test on 07/11/2023 using the [employer] Advance DXA System (analysis version: 14.10) manufactured by [employer]. The following summarizes the results of our evaluation. Technologist: LCE PATIENT BIOGRAPHICAL: Name: Adam Fuller, Adam Fuller Patient ID: [id number] Birth Date: Nov 22, 1945 Height: 68.0 in. Gender: Male Exam Date: 07/11/2023 Weight: 143.0 lbs. Indications: Advanced Age, Caucasian, COPD, Height Loss, Tobacco User Fractures: Treatments: Vitamin D ASSESSMENT: The BMD measured at Femur Neck Left is 0.658 g/cm2 with a T-score of -3.2. This is considered osteoporotic. The lumbar spine was not utilized due to surgical hardware. The scan quality is good. Site Region Measured Measured WHO Young Adult BMD Date       Age      Classification T-score DualFemur Neck Left 07/11/[age] 76.3 Osteoporosis -3.2 0.658 g/cm2 Left Forearm Radius 33% 07/11/2023 76.3 Osteoporosis -2.7 0.724 g/cm2 World Health Organization [facility]) criteria for post-menopausal, Caucasian Women: Normal:       T-score at or above -1 SD Osteopenia:   T-score between -1 and -2.5 SD Osteoporosis: T-score at or below -2.5 SD RECOMMENDATIONS: 1. All patients should optimize calcium and vitamin D intake. 2. Consider FDA-approved medical therapies in postmenopausal women and men aged 39 years and older, based on the following: a. A hip or vertebral (clinical or morphometric) fracture b. T-score < -2.5 at the femoral neck or spine after appropriate evaluation to exclude secondary causes c. Low bone mass (T-score between -1.0 and -2.5 at the femoral neck or spine) and a 10-year probability of a hip fracture > 3% or a 10-year probability of a major osteoporosis-related  fracture > 20% based on the [place]-adapted WHO algorithm d. Clinician judgment and/or patient preferences may indicate treatment for people with 10-year fracture probabilities above or below these levels FOLLOW-UP: People with diagnosed cases of osteoporosis or osteopenia should be regularly tested for bone mineral density. For patients eligible for Medicare, routine testing is allowed once every 2 years. The testing frequency can be increased to one year for patients who have rapidly progressing disease, or for those who are receiving medical therapy to restore bone mass. I have reviewed this report, and agree with the above findings. [facility] Electronically Signed   By: Frederico Hamman M.D.   On: 07/11/2023 15:43   MR LUMBAR SPINE WO CONTRAST  Result Date: 07/09/2023 CLINICAL DATA:  Lumbar compression fracture.  Fall 7 weeks ago EXAM: MRI LUMBAR SPINE WITHOUT CONTRAST TECHNIQUE: Multiplanar, multisequence MR imaging of the lumbar spine was performed. No intravenous contrast was administered. COMPARISON:  Lumbar CT 04/16/2023 FINDINGS: Segmentation:  Standard. Alignment:  Mild levocurvature Vertebrae: Subacute L2 compression fracture with 40% height loss when compared to  L1. There is posterior cortex involvement with mild buckling near the superior posterior corner. Mild marrow edema at the right L1-2 facet. No retropulsion or aggressive bone lesion. Some reactive focal edematous signal is seen in the L1 posterior and right-sided endplate where there is osteophyte and subchondral cyst. Remote L3 superior endplate/Schmorl's node. L4-S1 fusion with solid arthrodesis. Conus medullaris and cauda equina: Conus extends to the L1-2 level. Conus and cauda equina appear normal. Paraspinal and other soft tissues: Dilated infrarenal abdominal aorta measuring 3 cm in diameter. Sigmoid diverticulosis. Disc levels: T12- L1: Unremarkable. L1-L2: Disc narrowing and bulging with right foraminal protrusion and facet  spurring. Moderate right foraminal stenosis L2-L3: Mild disc desiccation and narrowing. L3-L4: Disc height loss with biforaminal protrusion. Facet spurring on both sides. Moderate spinal stenosis. Patent foramina L4-L5: PLIF.  No impingement L5-S1:PLIF.  No impingement IMPRESSION: 1. Subacute L2 compression fracture or with 40% height loss, mildly progressed from 04/16/2023 CT. Reactive edematous signal at the L1 inferior endplate and right L1-2 facet. 2. L4-S1 solid arthrodesis. 3. 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 4. Infrarenal abdominal aortic aneurysm measuring 3 cm in diameter. Recommend follow-up ultrasound every 3 years. This recommendation follows ACR consensus guidelines: White Paper of the ACR Incidental Findings Committee II on Vascular Findings. J Am Coll Radiol 2013; 10:789-794. Electronically Signed   By: Tiburcio Pea M.D.   On: 07/09/2023 06:10    Labs:  CBC: No results for input(s): "WBC", "HGB", "HCT", "PLT" in the last 8760 hours.  COAGS: No results for input(s): "INR", "APTT" in the last 8760 hours.  BMP: No results for input(s): "NA", "K", "CL", "CO2", "GLUCOSE", "BUN", "CALCIUM", "CREATININE", "GFRNONAA", "GFRAA" in the last 8760 hours.  Invalid input(s): "CMP"   Assessment and Plan:  Based on imaging and clinical evaluation, I believe Adam Fuller is a good candidate for L2 kyphoplasty. After discussing the risks and benefits of the procedure as well as the expectations for the procedure and post-procedural follow-up with him and his wife, he would like to proceed with L2 kyphoplasty. He was advised that there is no guarantee that the procedure will alleviate his pain but that it can take up to 2 weeks for the pain to subside even in successful cases. Furthermore, he was advised that his diagnosis of osteoporosis puts him at future risk of vertebral compression fractures at adjacent or other levels.  We will obtain blood for updated  labs today. He was counseled to pursue follow-up with his primary care doctor and/or endocrinology for further management of his new diagnosis of osteoporosis and for assistance with smoking cessation.  Thank you for this interesting consult.  I greatly enjoyed meeting Adam Fuller and look forward to participating in their care.  A copy of this report was sent to the requesting provider on this date.  Electronically Signed: Ardyth Gal, MD 07/13/2023, 1:50 PM   I spent a total of 40 Minutes in face to face in clinical consultation, greater than 50% of which was counseling/coordinating care for planned L2 kyphoplasty.

## 2023-07-14 LAB — PROTIME-INR
INR: 0.9
Prothrombin Time: 10.3 s (ref 9.0–11.5)

## 2023-07-14 LAB — COMPLETE METABOLIC PANEL WITH GFR
AG Ratio: 1.8 (calc) (ref 1.0–2.5)
ALT: 10 U/L (ref 9–46)
AST: 10 U/L (ref 10–35)
Albumin: 4.2 g/dL (ref 3.6–5.1)
Alkaline phosphatase (APISO): 87 U/L (ref 35–144)
BUN/Creatinine Ratio: 15 (calc) (ref 6–22)
BUN: 10 mg/dL (ref 7–25)
CO2: 28 mmol/L (ref 20–32)
Calcium: 9.5 mg/dL (ref 8.6–10.3)
Chloride: 100 mmol/L (ref 98–110)
Creat: 0.65 mg/dL — ABNORMAL LOW (ref 0.70–1.28)
Globulin: 2.4 g/dL (ref 1.9–3.7)
Glucose, Bld: 90 mg/dL (ref 65–99)
Potassium: 4.6 mmol/L (ref 3.5–5.3)
Sodium: 137 mmol/L (ref 135–146)
Total Bilirubin: 0.4 mg/dL (ref 0.2–1.2)
Total Protein: 6.6 g/dL (ref 6.1–8.1)
eGFR: 98 mL/min/{1.73_m2} (ref >=60)

## 2023-07-14 LAB — CBC
HCT: 46.5 % (ref 38.5–50.0)
Hemoglobin: 15 g/dL (ref 13.2–17.1)
MCH: 29.9 pg (ref 27.0–33.0)
MCHC: 32.3 g/dL (ref 32.0–36.0)
MCV: 92.8 fL (ref 80.0–100.0)
MPV: 10.3 fL (ref 7.5–12.5)
Platelets: 417 10*3/uL — ABNORMAL HIGH (ref 140–400)
RBC: 5.01 10*6/uL (ref 4.20–5.80)
RDW: 13 % (ref 11.0–15.0)
WBC: 9.4 10*3/uL (ref 3.8–10.8)

## 2023-07-16 ENCOUNTER — Other Ambulatory Visit: Payer: Self-pay | Admitting: Interventional Radiology

## 2023-07-16 ENCOUNTER — Telehealth: Payer: Self-pay | Admitting: Orthopedic Surgery

## 2023-07-16 DIAGNOSIS — S32020D Wedge compression fracture of second lumbar vertebra, subsequent encounter for fracture with routine healing: Secondary | ICD-10-CM

## 2023-07-16 DIAGNOSIS — S32020A Wedge compression fracture of second lumbar vertebra, initial encounter for closed fracture: Secondary | ICD-10-CM

## 2023-07-16 MED ORDER — OXYCODONE HCL 5 MG PO TABS: 5.0000 mg | ORAL_TABLET | Freq: Three times a day (TID) | ORAL | 0 refills | Status: DC | PRN

## 2023-07-16 NOTE — Telephone Encounter (Signed)
He is scheduled for kyphoplasty on 10/9.   PMP reviewed and is appropriate.   Refill of oxycodone sent to pharmacy. Please let him know.

## 2023-07-16 NOTE — Telephone Encounter (Signed)
Patient notified

## 2023-07-16 NOTE — Telephone Encounter (Signed)
Patient is calling to request a refill of Oxycodone.   [facility] at the corner of [address]

## 2023-07-18 ENCOUNTER — Ambulatory Visit
Admission: RE | Admit: 2023-07-18 | Discharge: 2023-07-18 | Disposition: A | Discharge status: Home / Self Care | Payer: Medicare Other | Source: Ambulatory Visit | Attending: Interventional Radiology | Admitting: Interventional Radiology

## 2023-07-18 DIAGNOSIS — S32020A Wedge compression fracture of second lumbar vertebra, initial encounter for closed fracture: Secondary | ICD-10-CM

## 2023-07-18 HISTORY — PX: IR KYPHO LUMBAR INC FX REDUCE BONE BX UNI/BIL CANNULATION INC/IMAGING: IMG5519

## 2023-07-18 MED ORDER — SODIUM CHLORIDE 0.9 % IV SOLN: INTRAVENOUS | Status: DC

## 2023-07-18 MED ORDER — FENTANYL CITRATE PF 50 MCG/ML IJ SOSY: 25.0000 ug | PREFILLED_SYRINGE | INTRAMUSCULAR | Status: DC | PRN

## 2023-07-18 MED ORDER — ACETAMINOPHEN 10 MG/ML IV SOLN
1000.0000 mg | Freq: Once | INTRAVENOUS | Status: AC
  Administered 2023-07-18: 1000 mg via INTRAVENOUS

## 2023-07-18 MED ORDER — CEFAZOLIN SODIUM-DEXTROSE 2-4 GM/100ML-% IV SOLN
2.0000 g | INTRAVENOUS | Status: AC
  Administered 2023-07-18: 2 g via INTRAVENOUS

## 2023-07-18 MED ORDER — FENTANYL CITRATE (PF) 100 MCG/2ML IJ SOLN
INTRAMUSCULAR | Status: AC | PRN
Start: 2023-07-18 — End: 2023-07-18
  Administered 2023-07-18 (×3): 50 ug via INTRAVENOUS

## 2023-07-18 MED ORDER — MIDAZOLAM HCL 2 MG/2ML IJ SOLN
INTRAMUSCULAR | Status: AC | PRN
  Administered 2023-07-18: .5 mg via INTRAVENOUS
  Administered 2023-07-18 (×3): 1 mg via INTRAVENOUS

## 2023-07-18 MED ORDER — MIDAZOLAM HCL 2 MG/2ML IJ SOLN: 1.0000 mg | INTRAMUSCULAR | Status: DC | PRN

## 2023-07-18 NOTE — Discharge Instructions (Signed)
Kyphoplasty Post Procedure Discharge Instructions  May resume a regular diet and any medications that you routinely take (including pain medications). However, if you are taking Aspirin or an anticoagulant/blood thinner you will be told when you can resume taking these by the healthcare provider. No driving day of procedure. The day of your procedure take it easy. You may use an ice pack as needed to injection sites on back.  Ice to back 30 minutes on and 30 minutes off, as needed. May remove bandaids tomorrow after taking a shower. Replace daily with a clean bandaid until healed.  Do not lift anything heavier than a milk jug for 1-2 weeks or determined by your physician.  Follow up with your physician in 2 weeks.    Please contact our office at [phone] for the following symptoms or if you have any questions:  Fever greater than 100 degrees Increased swelling, pain, or redness at injection site. Increased back and/or leg pain New numbness or change in symptoms from before the procedure.    Thank you for visiting [facility]. 

## 2023-07-24 ENCOUNTER — Telehealth: Payer: Self-pay | Admitting: Orthopedic Surgery

## 2023-07-24 DIAGNOSIS — S32020D Wedge compression fracture of second lumbar vertebra, subsequent encounter for fracture with routine healing: Secondary | ICD-10-CM

## 2023-07-24 MED ORDER — OXYCODONE HCL 5 MG PO TABS: 5.0000 mg | ORAL_TABLET | Freq: Two times a day (BID) | ORAL | 0 refills | Status: DC | PRN

## 2023-07-24 NOTE — Telephone Encounter (Signed)
He had L2 kyphoplasty on 07/18/23.   I called him. He has seen some slight improvement in pain. Does not look like he has f/u with IR.   Refill of oxycodone okay. PMP reviewed and is appropriate. Will do down to bid. Patient aware.   He will f/u with me in 4 weeks. Appointment made and will be mailed to him. He is aware of this as well.

## 2023-07-24 NOTE — Telephone Encounter (Signed)
Patient is calling to request a refill of Oxycodone.    [facility] [address]

## 2023-08-02 ENCOUNTER — Telehealth: Payer: Self-pay | Admitting: Orthopedic Surgery

## 2023-08-02 DIAGNOSIS — S32020D Wedge compression fracture of second lumbar vertebra, subsequent encounter for fracture with routine healing: Secondary | ICD-10-CM

## 2023-08-02 MED ORDER — OXYCODONE HCL 5 MG PO TABS: 5.0000 mg | ORAL_TABLET | Freq: Two times a day (BID) | ORAL | 0 refills | Status: DC | PRN

## 2023-08-02 NOTE — Telephone Encounter (Signed)
Surgery was on 10/9 and he was fine until Monday 07/23/2023. He sat in a bag chair on Sunday 10/13 for about 3 hours at a family gathering. He states he has not done any lifting over 10 pounds and can't think of another reason for the pain to start again. He is fine when he lays down but when he is up sitting and standing and walking the pain comes back in the middle and lower back. He states he can get out of the bed and it is ok until he starts moving around and walking.

## 2023-08-02 NOTE — Telephone Encounter (Signed)
Patient's wife, Lura Em is calling to let our office know that the patient is having a lot of pain. She states that the patient was beginning to have relief after surgery, but since then he's been in a lot of pain. She states that the patient is not really eating due to the pain and any time he is not laying in bed that he is having pain. The patient would like to know what he needs to do and also would like to request a refill of Oxycodone.  [facility] [address]

## 2023-08-03 ENCOUNTER — Ambulatory Visit
Admission: RE | Admit: 2023-08-03 | Discharge: 2023-08-03 | Disposition: A | Discharge status: Home / Self Care | Payer: Medicare Other | Source: Ambulatory Visit | Attending: Neurosurgery | Admitting: Neurosurgery

## 2023-08-03 ENCOUNTER — Other Ambulatory Visit: Payer: Self-pay | Admitting: Neurosurgery

## 2023-08-03 ENCOUNTER — Ambulatory Visit
Admission: RE | Admit: 2023-08-03 | Discharge: 2023-08-03 | Disposition: A | Discharge status: Home / Self Care | Payer: Medicare Other | Source: Ambulatory Visit | Attending: Neurosurgery | Admitting: *Deleted

## 2023-08-03 DIAGNOSIS — M546 Pain in thoracic spine: Secondary | ICD-10-CM | POA: Insufficient documentation

## 2023-08-03 DIAGNOSIS — S32020D Wedge compression fracture of second lumbar vertebra, subsequent encounter for fracture with routine healing: Secondary | ICD-10-CM

## 2023-08-03 NOTE — Telephone Encounter (Signed)
Patient notified and he will go on Monday for the images. He states there is no pain in the legs. He is aware to go to ER if pain gets worse or if he is having in leg weakness or pain.

## 2023-08-22 ENCOUNTER — Ambulatory Visit: Payer: Federal, State, Local not specified - PPO | Admitting: Orthopedic Surgery

## 2023-08-24 NOTE — Progress Notes (Unsigned)
Referring Physician:  Lauro Regulus, MD 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   History of L4-S1 spinal fusion. I have been following him for an L2 compression fracture s/p fall on around 04/02/23.   He had kyphoplasty L2 by IR on 07/18/23. He had some initial improvement and then started having increased pain at the end of October.   He is here for follow up.   As above, he did see improvement right after the kyphoplasty for about 5 days, then pain returned. He has constant LBP with no leg pain. His pain is worse as the day progresses. Some improvement with laying flat. Pain is worse with prolonged standing and walking. No numbness, tingling, or weakness.   Bowel/Bladder Dysfunction: none  Conservative measures:  Physical therapy:  none Multimodal medical therapy including regular antiinflammatories:  Ibuprofen, Prednisone, Metaxalone, Hydrocodone Injections:  no epidural steroid injections  Past Surgery:  L2 kyphoplasty on 07/18/23 3 previous back surgeries   Adam Fuller has no symptoms of cervical myelopathy.  The symptoms are causing a significant impact on the patient's life.   Review of Systems:  A 10 point review of systems is negative, except for the pertinent positives and negatives detailed in the HPI.  Past Medical History: No past medical history on file.  Past Surgical History: Past Surgical History:  Procedure Laterality Date   BACK SURGERY     IR KYPHO LUMBAR INC FX REDUCE BONE BX UNI/BIL CANNULATION INC/IMAGING  07/18/2023   IR RADIOLOGIST EVAL & MGMT  07/13/2023    Allergies: Allergies as of 08/27/2023 - Review Complete 07/16/2023  Allergen Reaction Noted   Varenicline Other (See Comments) 02/11/2013    Medications:  Current Outpatient Medications:    albuterol  (VENTOLIN HFA) 108 (90 Base) MCG/ACT inhaler, Inhale 1 puff into the lungs every 4 (four) hours as needed. (Patient not taking: Reported on 07/13/2023), Disp: , Rfl:    buPROPion (WELLBUTRIN XL) 150 MG 24 hr tablet, Take 150 mg by mouth daily., Disp: , Rfl:    buPROPion (WELLBUTRIN XL) 300 MG 24 hr tablet, Take 300 mg by mouth daily., Disp: , Rfl:    ibuprofen (ADVIL) 200 MG tablet, Take 200 mg by mouth every 6 (six) hours as needed., Disp: , Rfl:    methocarbamol (ROBAXIN) 500 MG tablet, Take 1 tablet (500 mg total) by mouth every 6 (six) hours as needed for muscle spasms., Disp: 120 tablet, Rfl: 0   oxyCODONE (ROXICODONE) 5 MG immediate release tablet, Take 1 tablet (5 mg total) by mouth every 12 (twelve) hours as needed for severe pain (pain score 7-10)., Disp: 14 tablet, Rfl: 0  Social History: Social History   Tobacco Use   Smoking status: Every Day   Smokeless tobacco: Never  Substance Use Topics   Alcohol use: Yes    Family Medical History: No family history on file.  Physical Examination: There were no vitals filed for this visit.     Awake, alert, oriented to person, place, and time.  Speech is clear and fluent.   Cranial Nerves: Pupils equal round and reactive to light.  Facial tone is symmetric.  Facial sensation is symmetric.   He has only minimal mid to lower lumbar tenderness.   Strength:  Side Iliopsoas Quads Hamstring PF DF EHL  R 5 5  5 5 5 5   L 5 5 5 5 5 5    Reflexes are 1+ and symmetric at the patella and achilles.     Bilateral lower extremity sensation is intact to light touch.    Gait is antalgic. He uses a cane.   He is wearing his LSO brace.    Medical Decision Making  Imaging: Xrays of thoracic spine dated 08/03/23:  FINDINGS: Vertebral body height is well maintained. Degenerative changes in the cervical spine are noted. No paraspinal mass is seen. Changes of prior vertebral augmentation at L2 is again noted.   IMPRESSION: No acute  compression deformity is noted.     Electronically Signed   By: Alcide Clever M.D.   On: 08/27/2023 00:16   Xrays of lumbar spine dated 08/03/23:  FINDINGS: Postsurgical changes are again noted at L4-5 and L5-S1. Compression deformity at L2 is seen with changes of prior vertebral augmentation. No new compression deformity is seen. No bony abnormality is noted.   IMPRESSION: Postsurgical changes without acute abnormality.     Electronically Signed   By: Alcide Clever M.D.   On: 08/27/2023 00:18  I have personally reviewed the images and agree with the above interpretation.   Assessment and Plan: Adam Fuller is a pleasant 77 y.o. male with L2 compression fracture s/p fall on around 04/02/23. He had kyphoplasty on 07/18/23 with only a few days relief. Also with history of lumbar fusion.   He continues with constant LBP with no leg pain. Some improvement with laying flat. Pain is worse with prolonged standing and walking. No numbness, tingling, or weakness.   Above xrays show kyphoplasty at L2, no new fractures seen.   MRI from 06/21/23 shows fusion L4-S1. 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   LBP likely due to underlying DDD/spondylosis especially at L3-L4.   Treatment options reviewed with patient and following plan made:   - Referral to pain management (Lateef) to discuss possible lumbar injections.  - New prescription for flexeril to use prn spasms. Reviewed dosing and side effects. May make him sleepy.  - Follow up with me in 6-8 weeks for recheck. If no improvement, may consider further imaging.   I spent a total of 25 minutes in face-to-face and non-face-to-face activities related to this patient's care today including review of outside records, review of imaging, review of symptoms, physical exam, discussion of differential diagnosis, discussion of treatment options, and documentation.   Drake Leach PA-C Neurosurgery

## 2023-08-27 ENCOUNTER — Encounter: Payer: Self-pay | Admitting: Orthopedic Surgery

## 2023-08-27 ENCOUNTER — Ambulatory Visit (INDEPENDENT_AMBULATORY_CARE_PROVIDER_SITE_OTHER): Payer: Medicare Other | Admitting: Orthopedic Surgery

## 2023-08-27 VITALS — BP 132/70 | Ht 71.0 in | Wt 145.0 lb

## 2023-08-27 DIAGNOSIS — M5136 Other intervertebral disc degeneration, lumbar region with discogenic back pain only: Secondary | ICD-10-CM | POA: Diagnosis not present

## 2023-08-27 DIAGNOSIS — Z981 Arthrodesis status: Secondary | ICD-10-CM | POA: Diagnosis not present

## 2023-08-27 DIAGNOSIS — S32020D Wedge compression fracture of second lumbar vertebra, subsequent encounter for fracture with routine healing: Secondary | ICD-10-CM | POA: Diagnosis not present

## 2023-08-27 DIAGNOSIS — W19XXXD Unspecified fall, subsequent encounter: Secondary | ICD-10-CM

## 2023-08-27 DIAGNOSIS — M47816 Spondylosis without myelopathy or radiculopathy, lumbar region: Secondary | ICD-10-CM

## 2023-08-27 DIAGNOSIS — Z9889 Other specified postprocedural states: Secondary | ICD-10-CM

## 2023-08-27 MED ORDER — CYCLOBENZAPRINE HCL 10 MG PO TABS
10.0000 mg | ORAL_TABLET | Freq: Three times a day (TID) | ORAL | 0 refills | Status: DC | PRN
Start: 2023-08-27 — End: 2023-09-20

## 2023-08-27 NOTE — Patient Instructions (Signed)
It was so nice to see you today. Thank you so much for coming in.    I am sorry that you are still hurting.   The kyphoplasty at L2 looks good on xray. I don't see any new fractures.   I think your pain may be from some arthritis above your fusion.   I want you to see pain management here in  (Dr. Cherylann Ratel) to discuss possible lumbar injections. They should call you to schedule an appointment or you can call them at [phone].   I sent a prescription for cyclobenzaprine to help with muscle spasms. Use only as needed and be careful, this can make you sleepy.   I will see you back in 6-8 weeks. Please do not hesitate to call if you have any questions or concerns. You can also message me in MyChart.   Drake Leach PA-C [phone]     The physicians and staff at [facility] Neurosurgery at [facility] are committed to providing excellent care. You may receive a survey asking for feedback about your experience at our office. We value you your feedback and appreciate you taking the time to to fill it out. The [facility] leadership team is also available to discuss your experience in person, feel free to contact us [phone].

## 2023-09-20 ENCOUNTER — Telehealth: Payer: Self-pay

## 2023-09-20 ENCOUNTER — Telehealth: Payer: Self-pay | Admitting: Orthopedic Surgery

## 2023-09-20 DIAGNOSIS — Z9889 Other specified postprocedural states: Secondary | ICD-10-CM

## 2023-09-20 DIAGNOSIS — Z981 Arthrodesis status: Secondary | ICD-10-CM

## 2023-09-20 DIAGNOSIS — M47816 Spondylosis without myelopathy or radiculopathy, lumbar region: Secondary | ICD-10-CM

## 2023-09-20 MED ORDER — CYCLOBENZAPRINE HCL 10 MG PO TABS
10.0000 mg | ORAL_TABLET | Freq: Three times a day (TID) | ORAL | 0 refills | Status: DC | PRN
Start: 2023-09-20 — End: 2023-10-22

## 2023-09-20 NOTE — Telephone Encounter (Signed)
I already spoke with the patient regarding this. (Please see other telephone encounter from 09/20/23).  He has already picked this up.

## 2023-09-20 NOTE — Telephone Encounter (Signed)
Fax from pharmacy that flexeril is not covered.   Please call and let him know. Does he want to pay out of pocket or does he want to try another muscle relaxer  They will cover robaxin- he was on this before, did it help?  They will also cover zanaflex.

## 2023-09-20 NOTE — Telephone Encounter (Signed)
I received a fax from [facility] that the prescription of Flexeril needed a prior authorization, before I initiated this I called the patient to verify if he had already picked this up. He stated yes he already picked this up from the pharmacy. No further action is needed at this time.

## 2023-09-20 NOTE — Telephone Encounter (Signed)
Flexeril refill sent to pharmacy. Please let him know.

## 2023-09-20 NOTE — Telephone Encounter (Signed)
Patient is calling to request a refill of Cyclobenzaprine be sent to [facility] at the corner of [address]. [address] and [address].

## 2023-10-16 ENCOUNTER — Ambulatory Visit
Payer: Federal, State, Local not specified - PPO | Admitting: Student in an Organized Health Care Education/Training Program

## 2023-10-22 ENCOUNTER — Other Ambulatory Visit: Payer: Self-pay | Admitting: Orthopedic Surgery

## 2023-10-22 DIAGNOSIS — M47816 Spondylosis without myelopathy or radiculopathy, lumbar region: Secondary | ICD-10-CM

## 2023-10-22 DIAGNOSIS — Z981 Arthrodesis status: Secondary | ICD-10-CM

## 2023-10-22 DIAGNOSIS — Z9889 Other specified postprocedural states: Secondary | ICD-10-CM

## 2023-11-02 NOTE — Progress Notes (Deleted)
Referring Physician:  Lauro Regulus, MD [address] [facility] [place],  [place] [address]  Primary Physician:  Lauro Regulus, MD  History of Present Illness: [name] has a history of chronic bronchitis.   History of L4-S1 spinal fusion. I have been following him for an L2 compression fracture s/p fall on around 04/02/23.   He had kyphoplasty L2 by IR on 07/18/23. He had some initial improvement and then started having increased pain at the end of October.   He is here for follow up.   Last seen by me on 08/27/23 for constant LBP with no leg pain. Xrays showed kyphoplasty at L2, no new fractures seen. MRI from 06/21/23 showed fusion L4-S1. 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    LBP likely due to underlying DDD/spondylosis especially at L3-L4.   He was referred to pain management at his last visit. He no showed this appointment and did not want to reschedule.   He is here for follow up.        As above, he did see improvement right after the kyphoplasty for about 5 days, then pain returned. He has constant LBP with no leg pain. His pain is worse as the day progresses. Some improvement with laying flat. Pain is worse with prolonged standing and walking. No numbness, tingling, or weakness.   Bowel/Bladder Dysfunction: none  Conservative measures:  Physical therapy:  none Multimodal medical therapy including regular antiinflammatories:  Ibuprofen, Prednisone, Metaxalone, Hydrocodone Injections:  no epidural steroid injections  Past Surgery:  L2 kyphoplasty on 07/18/23 3 previous back surgeries   Adam Fuller has no symptoms of cervical myelopathy.  The symptoms are causing a significant impact on the patient's life.   Review of Systems:  A 10 point review of systems is negative, except for the pertinent positives and negatives detailed in the HPI.  Past Medical History: No  past medical history on file.  Past Surgical History: Past Surgical History:  Procedure Laterality Date   BACK SURGERY     IR KYPHO LUMBAR INC FX REDUCE BONE BX UNI/BIL CANNULATION INC/IMAGING  07/18/2023   IR RADIOLOGIST EVAL & MGMT  07/13/2023    Allergies: Allergies as of 11/05/2023 - Review Complete 09/20/2023  Allergen Reaction Noted   Varenicline Other (See Comments) 02/11/2013    Medications:  Current Outpatient Medications:    cyclobenzaprine (FLEXERIL) 10 MG tablet, TAKE 1 TABLET BY MOUTH THREE TIMES DAILY AS NEEDED, Disp: 60 tablet, Rfl: 0   ibuprofen (ADVIL) 200 MG tablet, Take 200 mg by mouth every 6 (six) hours as needed., Disp: , Rfl:   Social History: Social History   Tobacco Use   Smoking status: Every Day   Smokeless tobacco: Never  Substance Use Topics   Alcohol use: Yes    Family Medical History: No family history on file.  Physical Examination: There were no vitals filed for this visit.     Awake, alert, oriented to person, place, and time.  Speech is clear and fluent.   Cranial Nerves: Pupils equal round and reactive to light.  Facial tone is symmetric.  Facial sensation is symmetric.   He has only minimal mid to lower lumbar tenderness.   Strength:  Side Iliopsoas Quads Hamstring PF DF EHL  R 5 5 5 5 5 5   L 5 5 5 5 5 5    Reflexes are 1+ and symmetric at the patella and achilles.  Bilateral lower extremity sensation is intact to light touch.    Gait is antalgic. He uses a cane.   He is wearing his LSO brace.    Medical Decision Making  Imaging: none  Assessment and Plan: [name] is a pleasant 78 y.o. male with L2 compression fracture s/p fall on around 04/02/23. He had kyphoplasty on 07/18/23 with only a few days relief. Also with history of lumbar fusion.   He continues with constant LBP with no leg pain. Some improvement with laying flat. Pain is worse with prolonged standing and walking. No numbness, tingling, or  weakness.   Above xrays show kyphoplasty at L2, no new fractures seen.   MRI from 06/21/23 shows fusion L4-S1. 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   LBP likely due to underlying DDD/spondylosis especially at L3-L4.   Treatment options reviewed with patient and following plan made:   - Referral to pain management (Lateef) to discuss possible lumbar injections.  - New prescription for flexeril to use prn spasms. Reviewed dosing and side effects. May make him sleepy.  - Follow up with me in 6-8 weeks for recheck. If no improvement, may consider further imaging.   I spent a total of 25 minutes in face-to-face and non-face-to-face activities related to this patient's care today including review of outside records, review of imaging, review of symptoms, physical exam, discussion of differential diagnosis, discussion of treatment options, and documentation.   Adam Leach PA-C Neurosurgery

## 2023-11-05 ENCOUNTER — Ambulatory Visit: Payer: Federal, State, Local not specified - PPO | Admitting: Orthopedic Surgery

## 2023-11-09 NOTE — Progress Notes (Unsigned)
Referring Physician:  No referring provider defined for this encounter.  Primary Physician:  Lauro Regulus, MD  History of Present Illness: Mr. Zaydon Kinser has a history of chronic bronchitis.   History of L4-S1 spinal fusion. I have been following him for an L2 compression fracture s/p fall on around 04/02/23.   He had kyphoplasty L2 by IR on 07/18/23. He had some initial improvement and then started having increased pain at the end of October.   He is here for follow up.   Last seen by me on 08/27/23 for constant LBP with no leg pain. Xrays showed kyphoplasty at L2, no new fractures seen. MRI from 06/21/23 showed fusion L4-S1. 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    LBP likely due to underlying DDD/spondylosis especially at L3-L4.   He was referred to pain management at his last visit. He no showed this appointment and did not want to reschedule.   He is here for follow up.   He is feeling better. He has intermittent LBP that is worse with standing and walking. He has no pain sitting or laying flat. He is able to do more without pain. No leg pain. No numbness, tingling, or weakness. He takes prn flexeril with good improvement in pain.    Bowel/Bladder Dysfunction: none  Conservative measures:  Physical therapy:  none Multimodal medical therapy including regular antiinflammatories:  Ibuprofen, Prednisone, Metaxalone, Hydrocodone Injections:  no epidural steroid injections  Past Surgery:  L2 kyphoplasty on 07/18/23 3 previous back surgeries   [name] has no symptoms of cervical myelopathy.  The symptoms are causing a significant impact on the patient's life.   Review of Systems:  A 10 point review of systems is negative, except for the pertinent positives and negatives detailed in the HPI.  Past Medical History: No past medical history on file.  Past Surgical History: Past Surgical History:  Procedure  Laterality Date   BACK SURGERY     IR KYPHO LUMBAR INC FX REDUCE BONE BX UNI/BIL CANNULATION INC/IMAGING  07/18/2023   IR RADIOLOGIST EVAL & MGMT  07/13/2023    Allergies: Allergies as of 11/12/2023 - Review Complete 11/12/2023  Allergen Reaction Noted   Varenicline Other (See Comments) 02/11/2013    Medications:  Current Outpatient Medications:    cyclobenzaprine (FLEXERIL) 10 MG tablet, TAKE 1 TABLET BY MOUTH THREE TIMES DAILY AS NEEDED, Disp: 60 tablet, Rfl: 0   ibuprofen (ADVIL) 200 MG tablet, Take 200 mg by mouth every 6 (six) hours as needed., Disp: , Rfl:   Social History: Social History   Tobacco Use   Smoking status: Every Day   Smokeless tobacco: Never  Substance Use Topics   Alcohol use: Yes    Family Medical History: No family history on file.  Physical Examination: Vitals:   11/12/23 1351  BP: 130/78    Awake, alert, oriented to person, place, and time.  Speech is clear and fluent.   Cranial Nerves: Pupils equal round and reactive to light.  Facial tone is symmetric.  Facial sensation is symmetric.   Strength:  Side Iliopsoas Quads Hamstring PF DF EHL  R 5 5 5 5 5 5   L 5 5 5 5 5 5    Reflexes are 1+ and symmetric at the patella and achilles.     Bilateral lower extremity sensation is intact to light touch.    His gait is better. He is not using a cane.  Medical Decision Making  Imaging: none  Assessment and Plan: Mr. Hartje is a pleasant 78 y.o. male with L2 compression fracture s/p fall on around 04/02/23. He had kyphoplasty on 07/18/23 with only a few days relief. Also with history of lumbar fusion.   He is feeling better. He has intermittent LBP that is worse with standing and walking. He has no pain sitting or laying flat. He is able to do more without pain. No leg pain. No numbness, tingling, or weakness.  Previous xrays showed kyphoplasty at L2, no new fractures seen. MRI from 06/21/23 showed fusion L4-S1. DDD/spondylosis at L2-L3 and  L3-L4. Also with moderate central stenosis L3-L4.   Treatment options reviewed with patient and following plan made:   - Current pain is intermittent and tolerable.  - Agree with holding on lumbar injections for now. Can revisit if pain gets worse.  - Continue on prn flexeril. Reviewed dosing and side effects. He knows this can make him sleepy. Can refill one or two more times, then he would need to get from PCP.  - He will f/u prn at his request.   I spent a total of 15 minutes in face-to-face and non-face-to-face activities related to this patient's care today including review of outside records, review of imaging, review of symptoms, physical exam, discussion of differential diagnosis, discussion of treatment options, and documentation.   Drake Leach PA-C Neurosurgery

## 2023-11-12 ENCOUNTER — Encounter: Payer: Self-pay | Admitting: Orthopedic Surgery

## 2023-11-12 ENCOUNTER — Ambulatory Visit (INDEPENDENT_AMBULATORY_CARE_PROVIDER_SITE_OTHER): Payer: Medicare Other | Admitting: Orthopedic Surgery

## 2023-11-12 VITALS — BP 130/78 | Ht 71.0 in | Wt 145.0 lb

## 2023-11-12 DIAGNOSIS — W19XXXD Unspecified fall, subsequent encounter: Secondary | ICD-10-CM

## 2023-11-12 DIAGNOSIS — M5136 Other intervertebral disc degeneration, lumbar region with discogenic back pain only: Secondary | ICD-10-CM

## 2023-11-12 DIAGNOSIS — Z981 Arthrodesis status: Secondary | ICD-10-CM

## 2023-11-12 DIAGNOSIS — M47816 Spondylosis without myelopathy or radiculopathy, lumbar region: Secondary | ICD-10-CM | POA: Diagnosis not present

## 2023-11-12 DIAGNOSIS — S32020D Wedge compression fracture of second lumbar vertebra, subsequent encounter for fracture with routine healing: Secondary | ICD-10-CM

## 2023-11-12 DIAGNOSIS — Z9889 Other specified postprocedural states: Secondary | ICD-10-CM

## 2023-11-19 ENCOUNTER — Telehealth: Payer: Self-pay | Admitting: Orthopedic Surgery

## 2023-11-19 ENCOUNTER — Other Ambulatory Visit: Payer: Self-pay

## 2023-11-19 DIAGNOSIS — Z981 Arthrodesis status: Secondary | ICD-10-CM

## 2023-11-19 DIAGNOSIS — Z9889 Other specified postprocedural states: Secondary | ICD-10-CM

## 2023-11-19 DIAGNOSIS — M47816 Spondylosis without myelopathy or radiculopathy, lumbar region: Secondary | ICD-10-CM

## 2023-11-19 MED ORDER — CYCLOBENZAPRINE HCL 10 MG PO TABS: 10.0000 mg | ORAL_TABLET | Freq: Three times a day (TID) | ORAL | 0 refills | Status: DC | PRN

## 2023-11-19 NOTE — Telephone Encounter (Signed)
 Patient is calling to request a refill of Cyclobenzaprine .   [facility] on the corner of [address] and [address]

## 2023-11-19 NOTE — Telephone Encounter (Signed)
 Per Stacy's last note on this patient, ok for one-two more refills. Will explain this to patient so he is aware he will need to get additional doses from PCP. Refill sent.

## 2023-11-19 NOTE — Telephone Encounter (Addendum)
 Patient acknowledges instructions that he will have to ask for refills in future from PCP.

## 2024-01-04 NOTE — Progress Notes (Signed)
 Referring Physician:  Lauro Regulus, MD [address] [facility] [place],  [place] [address]  Primary Physician:  Lauro Regulus, MD  History of Present Illness: Adam Fuller has a history of chronic bronchitis.   History of L4-S1 spinal fusion. I have been following him for an L2 compression fracture s/p fall on around 04/02/23.   He had kyphoplasty L2 by IR on 07/18/23. He had some initial improvement and then started having increased pain at the end of October.   Last seen by me on 11/12/23 and he was doing better with only intermittent LBP with standing and walking. He was to continue on prn flexeril.   He is here for follow up.   He was hooking up a machine to back of his tractor 2 weeks ago. He felt a slip in his lower back. Did not feel a pop. Since that ime, he's had constant LBP that is worse with standing and walking. No leg pain. No numbness, tingling, or weakness.   Bowel/Bladder Dysfunction: none  Conservative measures:  Physical therapy:  none Multimodal medical therapy including regular antiinflammatories:  Ibuprofen, Prednisone, Metaxalone, Hydrocodone Injections:  no epidural steroid injections  Past Surgery:  L2 kyphoplasty on 07/18/23 3 previous back surgeries   Adam Fuller has no symptoms of cervical myelopathy.  The symptoms are causing a significant impact on the patient's life.   Review of Systems:  A 10 point review of systems is negative, except for the pertinent positives and negatives detailed in the HPI.  Past Medical History: History reviewed. No pertinent past medical history.  Past Surgical History: Past Surgical History:  Procedure Laterality Date   BACK SURGERY     IR KYPHO LUMBAR INC FX REDUCE BONE BX UNI/BIL CANNULATION INC/IMAGING  07/18/2023   IR RADIOLOGIST EVAL & MGMT  07/13/2023    Allergies: Allergies as of 01/07/2024 - Review Complete 01/07/2024  Allergen Reaction Noted    Varenicline Other (See Comments) 02/11/2013    Medications:  Current Outpatient Medications:    cyclobenzaprine (FLEXERIL) 10 MG tablet, Take 1 tablet (10 mg total) by mouth 3 (three) times daily as needed for muscle spasms. This can make you sleepy., Disp: 90 tablet, Rfl: 0   ibuprofen (ADVIL) 200 MG tablet, Take 200 mg by mouth every 6 (six) hours as needed., Disp: , Rfl:   Social History: Social History   Tobacco Use   Smoking status: Every Day   Smokeless tobacco: Never  Substance Use Topics   Alcohol use: Yes    Family Medical History: History reviewed. No pertinent family history.  Physical Examination: Vitals:   01/07/24 1043  BP: 136/88   Awake, alert, oriented to person, place, and time.  Speech is clear and fluent.   Cranial Nerves: Pupils equal round and reactive to light.  Facial tone is symmetric.  Facial sensation is symmetric.   Strength:  Side Biceps Triceps Deltoid Interossei Grip Wrist Ext. Wrist Flex.  R 5 5 5 5 5 5 5   L 5 5 5 5 5 5 5     Side Iliopsoas Quads Hamstring PF DF EHL  R 5 5 5 5 5 5   L 5 5 5 5 5 5    Reflexes are 2+ and symmetric at the biceps, brachioradialis, patella and achilles.   Hoffman's is absent.  Clonus is not present.    Bilateral upper and lower extremity sensation is intact to light touch.     His  gait is slow.   He has no significant lower lumbar tenderness.    Medical Decision Making  Imaging: Lumbar xrays dated 01/07/24: (done after his visit) Instrumented fusion L4-S1, previous kyphoplasty at L2, apparent new compression fracture at L1.   Report not yet available for above xrays  Assessment and Plan: Adam Fuller has a 2 week history of constant LBP with no leg pain. He was hooking up a machine to back of his tractor and felt a slip in his lower back. Pain is worse with standing and walking. No numbness, tingling, or weakness.   Xrays after his visit show apparent new compression fracture at L1.   Treatment  options reviewed with patient and following plan made:   - Continue on prn flexeril. Reviewed dosing and side effects. He knows this can make him sleepy. Refill given.   Called his wife with above xray results (as he requested) and she let the patient know. Following plan  made with both of them:   - He will go back into LSO brace when up and walking. Do not wear to sleep.  - No bending, twisting, or lifting.  - He is interested in kyphoplasty procedure. Will order lumbar MRI to further evaluate compression fracture at L1.  - Prescription of oxycodone sent to pharmacy to take for severe pain. Reviewed dosing and side effects. PMP reviewed and is appropriate.  - Will plan do to phone visit to review lumbar MRI once I have the results.   I spent a total of 25 minutes in face-to-face and non-face-to-face activities related to this patient's care today including review of outside records, review of imaging, review of symptoms, physical exam, discussion of differential diagnosis, discussion of treatment options, and documentation.   Drake Leach PA-C Neurosurgery

## 2024-01-07 ENCOUNTER — Ambulatory Visit (INDEPENDENT_AMBULATORY_CARE_PROVIDER_SITE_OTHER): Admitting: Orthopedic Surgery

## 2024-01-07 ENCOUNTER — Encounter: Payer: Self-pay | Admitting: Orthopedic Surgery

## 2024-01-07 ENCOUNTER — Ambulatory Visit
Admission: RE | Admit: 2024-01-07 | Discharge: 2024-01-07 | Disposition: A | Discharge status: Home / Self Care | Attending: Orthopedic Surgery | Admitting: Orthopedic Surgery

## 2024-01-07 ENCOUNTER — Ambulatory Visit
Admission: RE | Admit: 2024-01-07 | Discharge: 2024-01-07 | Disposition: A | Discharge status: Home / Self Care | Source: Ambulatory Visit | Attending: Orthopedic Surgery | Admitting: Orthopedic Surgery

## 2024-01-07 VITALS — BP 136/88 | Ht 71.0 in | Wt 145.0 lb

## 2024-01-07 DIAGNOSIS — Z981 Arthrodesis status: Secondary | ICD-10-CM

## 2024-01-07 DIAGNOSIS — S32010A Wedge compression fracture of first lumbar vertebra, initial encounter for closed fracture: Secondary | ICD-10-CM

## 2024-01-07 DIAGNOSIS — M47816 Spondylosis without myelopathy or radiculopathy, lumbar region: Secondary | ICD-10-CM

## 2024-01-07 MED ORDER — OXYCODONE HCL 5 MG PO TABS
5.0000 mg | ORAL_TABLET | Freq: Four times a day (QID) | ORAL | 0 refills | Status: DC | PRN
Start: 2024-01-07 — End: 2024-01-16

## 2024-01-07 MED ORDER — CYCLOBENZAPRINE HCL 10 MG PO TABS
10.0000 mg | ORAL_TABLET | Freq: Three times a day (TID) | ORAL | 0 refills | Status: DC | PRN
Start: 2024-01-07 — End: 2024-02-12

## 2024-01-07 NOTE — Patient Instructions (Signed)
 It was so nice to see you today. Thank you so much for coming in.    I ordered xrays of your lower back. You can get these at [facility] (building with the white pillars) off of [address]. The address is [address], [place], [place] [address]. You do not need any appointment. We will call your wife with results.   I also sent a prescription for cyclobenzaprine to help with muscle spasms. Use only as needed and be careful, this can make you sleepy.   Depending on xray results, we may need to get lumbar MRI scan.   Please do not hesitate to call if you have any questions or concerns. You can also message me in MyChart.    Drake Leach PA-C [phone]     The physicians and staff at [facility] Neurosurgery at [facility] are committed to providing excellent care. You may receive a survey asking for feedback about your experience at our office. We value you your feedback and appreciate you taking the time to to fill it out. The [facility] leadership team is also available to discuss your experience in person, feel free to contact us [phone].

## 2024-01-12 ENCOUNTER — Ambulatory Visit
Admission: RE | Admit: 2024-01-12 | Discharge: 2024-01-12 | Disposition: A | Discharge status: Home / Self Care | Source: Ambulatory Visit | Attending: Orthopedic Surgery | Admitting: Orthopedic Surgery

## 2024-01-12 DIAGNOSIS — Z981 Arthrodesis status: Secondary | ICD-10-CM | POA: Insufficient documentation

## 2024-01-12 DIAGNOSIS — S32010A Wedge compression fracture of first lumbar vertebra, initial encounter for closed fracture: Secondary | ICD-10-CM | POA: Insufficient documentation

## 2024-01-12 DIAGNOSIS — M47816 Spondylosis without myelopathy or radiculopathy, lumbar region: Secondary | ICD-10-CM | POA: Diagnosis present

## 2024-01-16 ENCOUNTER — Telehealth: Payer: Self-pay | Admitting: Orthopedic Surgery

## 2024-01-16 MED ORDER — OXYCODONE HCL 5 MG PO TABS: 5.0000 mg | ORAL_TABLET | Freq: Four times a day (QID) | ORAL | 0 refills | Status: DC | PRN

## 2024-01-16 NOTE — Telephone Encounter (Signed)
 Patient states his pain is 8/10. Lower back without radiation.   Denies numbness, tingling, and  notes some leg weakness. Wife states that he has some difficulty walking.   Taking flexeril as prescribed. Taking ibuprofen as needed.    Has 5-6 tabs of flexeril left, wants to know if we can refill this.   He denies any significant discomfort despite jhis wife's reports.

## 2024-01-16 NOTE — Telephone Encounter (Signed)
 PMP reviewed and is appropriate.   Oxycodone script sent to pharmacy. Please let him know.   Remind him that both oxycodone and flexeril can make him sleepy, so be careful.

## 2024-01-16 NOTE — Addendum Note (Signed)
 Addended byDrake Leach on: 01/16/2024 09:57 AM   Modules accepted: Orders

## 2024-01-16 NOTE — Telephone Encounter (Signed)
 Patient's wife, Lura Em is calling to let our office know that the patient is in significant pain. She states that he has just been laying in the bed and crying from the pain. She also states that muscle relaxer's are not helping him at all. She is really concerned about her husband and wants to know what the next step is for him.

## 2024-01-16 NOTE — Telephone Encounter (Signed)
 Called [place] reading room. Scan will be read.

## 2024-01-16 NOTE — Telephone Encounter (Signed)
 I called Adam Fuller. Wife called very concerned about patient's well-being.   Saw Stacy on 3/31 due to new back pain and concern for fracture.   Last night, she noticed he was crying in pain. She says that he is not a emotional person and has never seen him so upset.   Concerns for failure to thrive with decrease in weight, lack of interest in doing anything and staying in bed except getting up to smoke.   Oxycodone is out, did not contact for a refill.   MRI Saturday, Happy to call reading room for a read.   I asked her to get her husband to call so I can get more of an assessment of his pain, if he has any new numbness, tingling, weakness.   May also consider [facility] referral if he has a PCP within the [facility] system.

## 2024-01-16 NOTE — Telephone Encounter (Signed)
 Should follow up with PCP regarding concerns for failure to thrive.   1 month supply of flexeril was sent to pharmacy on 01/07/24. He should not need refill. Did he pick this up?   If pain is severe, I can refill oxycodone to take only as needed. Does he want me to do this?   Has he been wearing his brace?

## 2024-01-16 NOTE — Telephone Encounter (Signed)
 I shared the recommendation from Stacy to follow up with PCP regarding concerns for failure to thrive.     His wife is going to see if his pharmacy has the Flexeril that was sent on 3/31.    He would appreciate the oxycodone prescription.    He is wearing his brace when up out of bed.   I will call the reading room to see if we can get his MRI read.

## 2024-01-21 NOTE — Progress Notes (Unsigned)
 Telephone Visit- Progress Note: Referring Physician:  Lauro Regulus, MD [address] [facility] [name],  [place] [address]  Primary Physician:  Lauro Regulus, MD  This visit was performed via telephone.  Patient location: home Provider location: office  I spent a total of 10 minutes non-face-to-face activities for this visit on the date of this encounter including review of current clinical condition and response to treatment.    Patient has given verbal consent to this telephone visits and we reviewed the limitations of a telephone visit. Patient wishes to proceed.    Chief Complaint:  review lumbar MRI  History of Present Illness: Adam Fuller is a 78 y.o. male has a history of  chronic bronchitis.    History of L4-S1 spinal fusion. I have been following him for an L2 compression fracture s/p fall on around 04/02/23.    He had kyphoplasty L2 by IR on 07/18/23. He had some initial improvement and pain slowly improved.    Last seen by me on 01/07/24 with increased constant LBP that started after hooking up machine to his tractor 2 weeks prior. Xrays were suspicious for L1 compression fracture.   I called him in some oxycodone last week for pain. He was to continue on prn flexeril.   Phone visit scheduled to review his lumbar MRI scan.   He continues with constant LBP that is worse with standing and walking. He thinks pain is about the same as it was at his last visit. No leg pain. No numbness, tingling, or weakness. He is wearing his brace- he thinks it helps, but he is still hurting a lot.   He is taking oxycodone and flexeril. Needs a refill of oxycodone.    Bowel/Bladder Dysfunction: none   Conservative measures:  Physical therapy:  none Multimodal medical therapy including regular antiinflammatories:  Ibuprofen, Prednisone, Metaxalone, Hydrocodone Injections:  no epidural steroid injections   Past Surgery:  L2 kyphoplasty on  07/18/23 3 previous back surgeries     [name] has no symptoms of cervical myelopathy.   The symptoms are causing a significant impact on the patient's life.    Exam: No exam done as this was a telephone encounter.     Imaging: Lumbar MRI dated 01/12/24:  FINDINGS: Segmentation:  Standard.   Alignment:  Normal.   Vertebrae: The patient has an acute or early subacute compression fracture of L1 with vertebral body height loss centrally of up to 30%. Remote L2 compression fracture where the patient is status post vertebral augmentation again seen. No other fracture or focal marrow lesion. Again seen is postoperative change of L4-S1 fusion. No evidence of discitis.   Conus medullaris and cauda equina: Conus extends to the L1-2 level. Conus and cauda equina appear normal.   Paraspinal and other soft tissues: 3.0 cm abdominal aorta again seen.   Disc levels:   T11-12 is imaged in the sagittal plane only. There are shallow left and right paracentral protrusions without stenosis.   T12-L1: Minimal retropulsion off the superior endplate of L1. No stenosis.   L2-3: Shallow disc bulge and moderate facet arthropathy. No stenosis.   L3-4: Minimal disc bulge.  No stenosis.   L3-4: Shallow disc bulge, mild facet arthropathy and moderate ligamentum flavum thickening. Mild to moderate central canal stenosis and mild bilateral foraminal narrowing again seen.   L4-S1: Status post fusion.  No stenosis.   L5-S1: Status post fusion.  No stenosis.   IMPRESSION:  1. Acute or early subacute compression fracture of L1 with vertebral body height loss centrally of up to 30%. 2. Remote L2 compression fracture status post vertebral augmentation. 3. Status post L4-S1 fusion. No stenosis at the postoperative levels. 4. Mild to moderate central canal and mild bilateral foraminal narrowing at L3-4. 5. 3.0 cm abdominal aortic aneurysm. Recommend follow-up every 3 years.      Electronically Signed   By: Etheleen Her M.D.   On: 01/19/2024 12:19   I have personally reviewed the images and agree with the above interpretation.  Assessment and Plan: Adam Fuller had injury around 12/24/23 with new L1 compression fracture. He has history of L4-S1 fusion as well as L2 kyphoplasty.   He has constant LBP that is worse with standing and walking. He thinks pain is about the same as it was at his last visit. No leg pain. No numbness, tingling, or weakness.   MRI shows acute/early subacute compression fracture of L1.    Treatment options reviewed with patient and following plan made:   - He is still having a lot of pain so will refer to IR to consider L1 kyphoplasty. He did well with previous L2 kyphoplasty.  - Continue on prn flexeril. Reviewed dosing and side effects. He knows this can make him sleepy.  - Continue on prn oxycodone for severe pain. Reviewed dosing and side effects. PMP reviewed and is appropriate. Refill given.  - He will continue to wear back brace when up and walking. No bending, twisting, or lifting.  - Will plan to regroup with him after he sees IR.   Of note, lumbar MRI showed AAA that was seen on previous imaging. His PCP is aware of this and he has follow up scheduled.   Lucetta Russel PA-C Neurosurgery

## 2024-01-23 ENCOUNTER — Ambulatory Visit: Admitting: Orthopedic Surgery

## 2024-01-23 ENCOUNTER — Encounter: Payer: Self-pay | Admitting: Orthopedic Surgery

## 2024-01-23 DIAGNOSIS — S32010D Wedge compression fracture of first lumbar vertebra, subsequent encounter for fracture with routine healing: Secondary | ICD-10-CM

## 2024-01-23 DIAGNOSIS — S32010A Wedge compression fracture of first lumbar vertebra, initial encounter for closed fracture: Secondary | ICD-10-CM

## 2024-01-23 MED ORDER — OXYCODONE HCL 5 MG PO TABS: 5.0000 mg | ORAL_TABLET | Freq: Four times a day (QID) | ORAL | 0 refills | Status: DC | PRN

## 2024-01-29 ENCOUNTER — Other Ambulatory Visit (HOSPITAL_COMMUNITY): Payer: Self-pay | Admitting: Interventional Radiology

## 2024-01-29 ENCOUNTER — Ambulatory Visit
Admission: RE | Admit: 2024-01-29 | Discharge: 2024-01-29 | Disposition: A | Discharge status: Home / Self Care | Source: Ambulatory Visit | Attending: Orthopedic Surgery | Admitting: Orthopedic Surgery

## 2024-01-29 ENCOUNTER — Other Ambulatory Visit: Payer: Self-pay | Admitting: Interventional Radiology

## 2024-01-29 VITALS — BP 157/81 | HR 94 | Temp 98.2°F | Resp 18

## 2024-01-29 DIAGNOSIS — S32010A Wedge compression fracture of first lumbar vertebra, initial encounter for closed fracture: Secondary | ICD-10-CM

## 2024-01-29 DIAGNOSIS — M8008XA Age-related osteoporosis with current pathological fracture, vertebra(e), initial encounter for fracture: Secondary | ICD-10-CM

## 2024-01-29 HISTORY — PX: IR RADIOLOGIST EVAL & MGMT: IMG5224

## 2024-01-29 NOTE — Consult Note (Signed)
 Chief Complaint: Patient was seen in consultation today for L1 compression fracture at the request of Luna,Stacy  Referring Physician(s): Luna,Stacy  History of Present Illness: Adam Fuller is a 78 y.o. male with a known history of osteoporosis and previous T12 compression fracture which responded well to cement augmentation presents with recurrent back pain.  His pain began suddenly several weeks ago just after he finished hooking something up to his tractor.    Subsequent MRI reveals an acute to subacute fracture at L1 with 30% height loss.  His pain is severe and constant rating an 8 out of 10 on a 10 point scale.  He is taking hydrocodone with minimal relief.  His pain is disabling, he scored a 21/24 on the [employer].     No past medical history on file.  Past Surgical History:  Procedure Laterality Date   BACK SURGERY     IR KYPHO LUMBAR INC FX REDUCE BONE BX UNI/BIL CANNULATION INC/IMAGING  07/18/2023   IR RADIOLOGIST EVAL & MGMT  07/13/2023   IR RADIOLOGIST EVAL & MGMT  01/29/2024    Allergies: Varenicline  Medications: Prior to Admission medications   Medication Sig Start Date End Date Taking? Authorizing Provider  cyclobenzaprine  (FLEXERIL ) 10 MG tablet Take 1 tablet (10 mg total) by mouth 3 (three) times daily as needed for muscle spasms. This can make you sleepy. 01/07/24  Yes Lucetta Russel, PA-C  ibuprofen (ADVIL) 200 MG tablet Take 200 mg by mouth every 6 (six) hours as needed.   Yes [provider]  oxyCODONE  (ROXICODONE ) 5 MG immediate release tablet Take 1 tablet (5 mg total) by mouth every 6 (six) hours as needed for severe pain (pain score 7-10). 01/23/24  Yes Lucetta Russel, PA-C     No family history on file.  Social History   Socioeconomic History   Marital status: Married    Spouse name: Not on file   Number of children: Not on file   Years of education: Not on file   Highest education level: Not on file   Occupational History   Not on file  Tobacco Use   Smoking status: Every Day   Smokeless tobacco: Never  Substance and Sexual Activity   Alcohol use: Yes   Drug use: Not on file   Sexual activity: Not on file  Other Topics Concern   Not on file  Social History Narrative   Not on file   Social Drivers of Health   Financial Resource Strain: Low Risk  (05/15/2023)   Received from [facility]   Overall Financial Resource Strain (CARDIA)    Difficulty of Paying Living Expenses: Not hard at all  Food Insecurity: No Food Insecurity (05/15/2023)   Received from [facility]   Hunger Vital Sign    Worried About Running Out of Food in the Last Year: Never true    Ran Out of Food in the Last Year: Never true  Transportation Needs: No Transportation Needs (05/15/2023)   Received from [facility]    In the past 12 months, has lack of transportation kept you from medical appointments or from getting medications?: No    Lack of Transportation (Non-Medical): No  Physical Activity: Not on file  Stress: Not on file  Social Connections: Not on file   Review of Systems: A 12 point ROS discussed and pertinent positives are indicated in the HPI above.  All  other systems are negative.  Review of Systems  Vital Signs: BP (!) 157/81   Pulse 94   Temp 98.2 F (36.8 C) (Oral)   Resp 18   SpO2 96%    Physical Exam Constitutional:      General: He is not in acute distress.    Appearance: Normal appearance. He is normal weight.  HENT:     Head: Normocephalic and atraumatic.  Eyes:     General: No scleral icterus. Cardiovascular:     Rate and Rhythm: Normal rate.  Pulmonary:     Effort: Pulmonary effort is normal.  Abdominal:     General: Abdomen is flat. There is no distension.     Palpations: Abdomen is soft.     Tenderness: There is no abdominal tenderness.  Musculoskeletal:       Back:     Comments: TTP  over the L1 spinous process  Skin:    General: Skin is warm and dry.  Neurological:     Mental Status: He is alert and oriented to person, place, and time.  Psychiatric:        Mood and Affect: Mood normal.        Behavior: Behavior normal.       Imaging: IR Radiologist Eval & Mgmt Result Date: 01/29/2024 EXAM: NEW PATIENT OFFICE VISIT CHIEF COMPLAINT: SEE NOTE IN EPIC HISTORY OF PRESENT ILLNESS: SEE NOTE IN EPIC REVIEW OF SYSTEMS: SEE NOTE IN EPIC PHYSICAL EXAMINATION: SEE NOTE IN EPIC ASSESSMENT AND PLAN: SEE NOTE IN EPIC Electronically Signed   By: Fernando Hoyer M.D.   On: 01/29/2024 08:47   MR LUMBAR SPINE WO CONTRAST Result Date: 01/19/2024 CLINICAL DATA:  Low back pain. History of lumbar surgery and prior compression fracture. EXAM: MRI LUMBAR SPINE WITHOUT CONTRAST TECHNIQUE: Multiplanar, multisequence MR imaging of the lumbar spine was performed. No intravenous contrast was administered. COMPARISON:  Plain films lumbar spine 01/07/2024 and 08/03/2023. FINDINGS: Segmentation:  Standard. Alignment:  Normal. Vertebrae: The patient has an acute or early subacute compression fracture of L1 with vertebral body height loss centrally of up to 30%. Remote L2 compression fracture where the patient is status post vertebral augmentation again seen. No other fracture or focal marrow lesion. Again seen is postoperative change of L4-S1 fusion. No evidence of discitis. Conus medullaris and cauda equina: Conus extends to the L1-2 level. Conus and cauda equina appear normal. Paraspinal and other soft tissues: 3.0 cm abdominal aorta again seen. Disc levels: T11-12 is imaged in the sagittal plane only. There are shallow left and right paracentral protrusions without stenosis. T12-L1: Minimal retropulsion off the superior endplate of L1. No stenosis. L2-3: Shallow disc bulge and moderate facet arthropathy. No stenosis. L3-4: Minimal disc bulge.  No stenosis. L3-4: Shallow disc bulge, mild facet arthropathy  and moderate ligamentum flavum thickening. Mild to moderate central canal stenosis and mild bilateral foraminal narrowing again seen. L4-S1: Status post fusion.  No stenosis. L5-S1: Status post fusion.  No stenosis. IMPRESSION: 1. Acute or early subacute compression fracture of L1 with vertebral body height loss centrally of up to 30%. 2. Remote L2 compression fracture status post vertebral augmentation. 3. Status post L4-S1 fusion. No stenosis at the postoperative levels. 4. Mild to moderate central canal and mild bilateral foraminal narrowing at L3-4. 5. 3.0 cm abdominal aortic aneurysm. Recommend follow-up every 3 years. Electronically Signed   By: Etheleen Her M.D.   On: 01/19/2024 12:19   DG Lumbar Spine 2-3 Views Result Date: 01/08/2024 CLINICAL  DATA:  History of lumbar fusion July 18, 2023 EXAM: LUMBAR SPINE - 2-3 VIEW COMPARISON:  Lumbar spine August 03, 2023 FINDINGS: Stable post L4-5 L5-S1 transpedicular fixation and anterior fusion of the L5-S1 with intervertebral disc replacement L4 and L5 levels. Near anatomic alignment Old fracture deformity L2 with prior vertebroplasty No new fracture No spondylolysis or listhesis Minimal levoscoliosis thoracolumbar spine IMPRESSION: *Stable postoperative changes. *Old fracture deformity L2 with prior vertebroplasty. *No new fracture. Electronically Signed   By: Fredrich Jefferson M.D.   On: 01/08/2024 13:04    Labs:  CBC: Recent Labs    07/13/23 1430  WBC 9.4  HGB 15.0  HCT 46.5  PLT 417*    COAGS: Recent Labs    07/13/23 1430  INR 0.9    BMP: Recent Labs    07/13/23 1430  NA 137  K 4.6  CL 100  CO2 28  GLUCOSE 90  BUN 10  CALCIUM 9.5  CREATININE 0.65*    LIVER FUNCTION TESTS: Recent Labs    07/13/23 1430  BILITOT 0.4  AST 10  ALT 10  PROT 6.6    TUMOR MARKERS: No results for input(s): "AFPTM", "CEA", "CA199", "CHROMGRNA" in the last 8760 hours.  Assessment & Plan:   Patient has suffered subacute osteoporotic  fracture of the L1 vertebra.   History and exam have demonstrated the following:  Acute/Subacute fracture by imaging dated 01/12/24, Pain on exam concordant with level of fracture, Failure of conservative therapy and pain refractory to narcotic pain mediation, and Significant disability on the [employer] Disability Questionnaire with 21/24 positive symptoms, reflecting significant impact/impairment of (ADLs)   ICD-10-CM Codes that Support Medical Necessity ([email or web address]?[email or web address])  M80.08XA    Age-related osteoporosis with current pathological fracture, vertebra(e), initial encounter for fracture   Plan:  L1 vertebral body augmentation with balloon kyphoplasty  Post-procedure disposition: outpatient Medication holds: None  The patient has suffered a fracture of the L1 vertebral body. It is recommended that patients aged 63 years or older be evaluated for possible testing or treatment of osteoporosis. A copy of this consult report is sent to the patient's referring physician.  Advanced Care Plan: The patient did not want to provide an Advanced Care Plan at the time of this visit     Total time spent on today's visit was over  40 Minutes  including both face-to-face time and non face-to-face time, personally spent on review of chart (including labs and relevant imaging), discussing further workup and treatment options, referral to specialist if needed, reviewing outside records if pertinent, answering patient questions, and coordinating care regarding osteoporotic fracture of L1 as well as management strategy.     Electronically Signed: Roxie Cord 01/29/2024, 10:07 AM

## 2024-01-30 ENCOUNTER — Other Ambulatory Visit: Payer: Self-pay | Admitting: Physician Assistant

## 2024-01-30 ENCOUNTER — Telehealth: Payer: Self-pay | Admitting: Orthopedic Surgery

## 2024-01-30 DIAGNOSIS — S32010A Wedge compression fracture of first lumbar vertebra, initial encounter for closed fracture: Secondary | ICD-10-CM

## 2024-01-30 LAB — COMPREHENSIVE METABOLIC PANEL WITH GFR
AG Ratio: 1.7 (calc) (ref 1.0–2.5)
ALT: 6 U/L — ABNORMAL LOW (ref 9–46)
AST: 9 U/L — ABNORMAL LOW (ref 10–35)
Albumin: 4 g/dL (ref 3.6–5.1)
Alkaline phosphatase (APISO): 89 U/L (ref 35–144)
BUN: 9 mg/dL (ref 7–25)
CO2: 27 mmol/L (ref 20–32)
Calcium: 9.2 mg/dL (ref 8.6–10.3)
Chloride: 104 mmol/L (ref 98–110)
Creat: 0.7 mg/dL (ref 0.70–1.28)
Globulin: 2.3 g/dL (ref 1.9–3.7)
Glucose, Bld: 85 mg/dL (ref 65–99)
Potassium: 4.9 mmol/L (ref 3.5–5.3)
Sodium: 138 mmol/L (ref 135–146)
Total Bilirubin: 0.3 mg/dL (ref 0.2–1.2)
Total Protein: 6.3 g/dL (ref 6.1–8.1)
eGFR: 95 mL/min/{1.73_m2} (ref >=60)

## 2024-01-30 LAB — CBC WITH DIFFERENTIAL/PLATELET
Absolute Lymphocytes: 1058 {cells}/uL (ref 850–3900)
Absolute Monocytes: 568 {cells}/uL (ref 200–950)
Basophils Absolute: 86 {cells}/uL (ref 0–200)
Basophils Relative: 1 %
Eosinophils Absolute: 430 {cells}/uL (ref 15–500)
Eosinophils Relative: 5 %
HCT: 43.1 % (ref 38.5–50.0)
Hemoglobin: 14.1 g/dL (ref 13.2–17.1)
MCH: 29.7 pg (ref 27.0–33.0)
MCHC: 32.7 g/dL (ref 32.0–36.0)
MCV: 90.7 fL (ref 80.0–100.0)
MPV: 10.2 fL (ref 7.5–12.5)
Monocytes Relative: 6.6 %
Neutro Abs: 6459 {cells}/uL (ref 1500–7800)
Neutrophils Relative %: 75.1 %
Platelets: 321 10*3/uL (ref 140–400)
RBC: 4.75 10*6/uL (ref 4.20–5.80)
RDW: 12.9 % (ref 11.0–15.0)
Total Lymphocyte: 12.3 %
WBC: 8.6 10*3/uL (ref 3.8–10.8)

## 2024-01-30 LAB — PROTIME-INR
INR: 0.9
Prothrombin Time: 10.3 s (ref 9.0–11.5)

## 2024-01-30 LAB — APTT: aPTT: 31 s (ref 23–32)

## 2024-01-30 MED ORDER — OXYCODONE HCL 5 MG PO TABS: 5.0000 mg | ORAL_TABLET | Freq: Four times a day (QID) | ORAL | 0 refills | Status: DC | PRN

## 2024-01-30 NOTE — Telephone Encounter (Signed)
 Patient notified

## 2024-01-30 NOTE — Telephone Encounter (Signed)
 Patient requesting refill from [facility]. I told him she is out of the office this week, we will have someone else review the message. Oxycodone  5mg  2 x aday [facility] [address] and [address] He is scheduled for kyphoplasty on 02/05/2024.

## 2024-02-04 ENCOUNTER — Telehealth: Payer: Self-pay

## 2024-02-04 NOTE — Discharge Instructions (Signed)

## 2024-02-05 ENCOUNTER — Ambulatory Visit
Admission: RE | Admit: 2024-02-05 | Discharge: 2024-02-05 | Disposition: A | Discharge status: Home / Self Care | Source: Ambulatory Visit | Attending: Interventional Radiology | Admitting: Interventional Radiology

## 2024-02-05 DIAGNOSIS — S32010A Wedge compression fracture of first lumbar vertebra, initial encounter for closed fracture: Secondary | ICD-10-CM

## 2024-02-05 DIAGNOSIS — M8008XA Age-related osteoporosis with current pathological fracture, vertebra(e), initial encounter for fracture: Secondary | ICD-10-CM

## 2024-02-05 HISTORY — PX: IR KYPHO LUMBAR INC FX REDUCE BONE BX UNI/BIL CANNULATION INC/IMAGING: IMG5519

## 2024-02-05 MED ORDER — LIDOCAINE HCL 1 % IJ SOLN
20.0000 mL | Freq: Once | INTRAMUSCULAR | Status: AC
  Administered 2024-02-05: 20 mL via INTRADERMAL

## 2024-02-05 MED ORDER — CEFAZOLIN SODIUM-DEXTROSE 2-4 GM/100ML-% IV SOLN
2.0000 g | INTRAVENOUS | Status: AC
  Administered 2024-02-05: 2 g via INTRAVENOUS

## 2024-02-05 MED ORDER — ACETAMINOPHEN 10 MG/ML IV SOLN
1000.0000 mg | Freq: Once | INTRAVENOUS | Status: AC
  Administered 2024-02-05: 1000 mg via INTRAVENOUS

## 2024-02-05 MED ORDER — MIDAZOLAM HCL 2 MG/2ML IJ SOLN
INTRAMUSCULAR | Status: DC | PRN
  Administered 2024-02-05 (×2): 1 mg via INTRAVENOUS

## 2024-02-05 MED ORDER — SODIUM CHLORIDE 0.9 % IV SOLN: INTRAVENOUS | Status: DC

## 2024-02-05 MED ORDER — FENTANYL CITRATE PF 50 MCG/ML IJ SOSY: 25.0000 ug | PREFILLED_SYRINGE | INTRAMUSCULAR | Status: DC | PRN

## 2024-02-05 MED ORDER — FENTANYL CITRATE (PF) 100 MCG/2ML IJ SOLN
INTRAMUSCULAR | Status: DC | PRN
  Administered 2024-02-05 (×2): 50 ug via INTRAVENOUS

## 2024-02-05 MED ORDER — MIDAZOLAM HCL 2 MG/2ML IJ SOLN
1.0000 mg | INTRAMUSCULAR | Status: DC | PRN
Start: 2024-02-05 — End: 2024-02-06

## 2024-02-06 ENCOUNTER — Telehealth: Payer: Self-pay

## 2024-02-07 ENCOUNTER — Other Ambulatory Visit: Payer: Self-pay | Admitting: Interventional Radiology

## 2024-02-07 DIAGNOSIS — S32010G Wedge compression fracture of first lumbar vertebra, subsequent encounter for fracture with delayed healing: Secondary | ICD-10-CM

## 2024-02-12 ENCOUNTER — Ambulatory Visit
Admission: RE | Admit: 2024-02-12 | Discharge: 2024-02-12 | Disposition: A | Discharge status: Home / Self Care | Source: Ambulatory Visit | Attending: Interventional Radiology | Admitting: Interventional Radiology

## 2024-02-12 ENCOUNTER — Other Ambulatory Visit (HOSPITAL_COMMUNITY): Payer: Self-pay | Admitting: Interventional Radiology

## 2024-02-12 DIAGNOSIS — S32010G Wedge compression fracture of first lumbar vertebra, subsequent encounter for fracture with delayed healing: Secondary | ICD-10-CM

## 2024-02-12 HISTORY — PX: IR RADIOLOGIST EVAL & MGMT: IMG5224

## 2024-02-12 MED ORDER — ANAPROX DS 550 MG PO TABS: 550.0000 mg | ORAL_TABLET | Freq: Two times a day (BID) | ORAL | 0 refills | Status: AC

## 2024-02-12 MED ORDER — METHOCARBAMOL 750 MG PO TABS: 750.0000 mg | ORAL_TABLET | Freq: Four times a day (QID) | ORAL | 0 refills | Status: DC | PRN

## 2024-02-12 NOTE — Progress Notes (Signed)
 Chief Complaint: Patient was seen in consultation today for back pain, osteoporotic L1 compression fracture at the request of Meriel Kelliher K  Referring Physician(s): Banner Huckaba K  History of Present Illness: Adam Fuller is a 78 y.o. male with a known history of osteoporosis and previous T12 compression fracture which responded well to cement augmentation presents with recurrent back pain.  His pain began suddenly several weeks ago just after he finished hooking something up to his tractor.     Subsequent MRI reveals an acute to subacute fracture at L1 with 30% height loss.  His pain was severe and constant rating an 8 out of 10 on a 10 point scale.    He underwent cement augmentation with balloon kyphoplasty at L1 on 02/05/2024.  He presents today for his wife for follow-up evaluation.  His pain has come down from an 8 out of 10 to a 4 or 5 out of 10.  However, his pain remains nearly constant but worsened with standing.  His pain is located lower in his back than his prior fracture pain.  He feels that it may be related to arthritis in his back.    No past medical history on file.  Past Surgical History:  Procedure Laterality Date   BACK SURGERY     IR KYPHO LUMBAR INC FX REDUCE BONE BX UNI/BIL CANNULATION INC/IMAGING  07/18/2023   IR KYPHO LUMBAR INC FX REDUCE BONE BX UNI/BIL CANNULATION INC/IMAGING  02/05/2024   IR RADIOLOGIST EVAL & MGMT  07/13/2023   IR RADIOLOGIST EVAL & MGMT  01/29/2024   IR RADIOLOGIST EVAL & MGMT  02/12/2024    Allergies: Varenicline  Medications: Prior to Admission medications   Medication Sig Start Date End Date Taking? Authorizing Provider  ANAPROX DS 550 MG tablet Take 1 tablet (550 mg total) by mouth 2 (two) times daily with a meal for 7 days. 02/12/24 02/19/24  Roxie Cord, MD  ibuprofen (ADVIL) 200 MG tablet Take 200 mg by mouth every 6 (six) hours as needed.    [provider]  methocarbamol  (ROBAXIN ) 750 MG tablet  Take 1 tablet (750 mg total) by mouth every 6 (six) hours as needed for muscle spasms. 02/12/24   Roxie Cord, MD  oxyCODONE  (ROXICODONE ) 5 MG immediate release tablet Take 1 tablet (5 mg total) by mouth every 6 (six) hours as needed for severe pain (pain score 7-10). 01/30/24   Ludwig Safer, PA-C     No family history on file.  Social History   Socioeconomic History   Marital status: Married    Spouse name: Not on file   Number of children: Not on file   Years of education: Not on file   Highest education level: Not on file  Occupational History   Not on file  Tobacco Use   Smoking status: Every Day   Smokeless tobacco: Never  Substance and Sexual Activity   Alcohol use: Yes   Drug use: Not on file   Sexual activity: Not on file  Other Topics Concern   Not on file  Social History Narrative   Not on file   Social Drivers of Health   Financial Resource Strain: Low Risk  (05/15/2023)   Received from [facility]   Overall Financial Resource Strain (CARDIA)    Difficulty of Paying Living Expenses: Not hard at all  Food Insecurity: No Food Insecurity (05/15/2023)   Received from [facility]   Hunger Vital Sign  Worried About Programme researcher, broadcasting/film/video in the Last Year: Never true    Ran Out of Food in the Last Year: Never true  Transportation Needs: No Transportation Needs (05/15/2023)   Received from [facility]    In the past 12 months, has lack of transportation kept you from medical appointments or from getting medications?: No    Lack of Transportation (Non-Medical): No  Physical Activity: Not on file  Stress: Not on file  Social Connections: Not on file   Review of Systems: A 12 point ROS discussed and pertinent positives are indicated in the HPI above.  All other systems are negative.  Review of Systems  Vital Signs: BP (!) 149/81   Pulse 98   Temp 98.3 F (36.8 C)   Resp 16    SpO2 97%   Advance Care Plan: The advanced care plan/surrogate decision maker was discussed at the time of visit and the patient did not wish to discuss or was not able to name a surrogate decision maker or provide an advance care plan.    Physical Exam Constitutional:      General: He is not in acute distress.    Appearance: Normal appearance. He is normal weight.  HENT:     Head: Normocephalic and atraumatic.  Eyes:     General: No scleral icterus. Cardiovascular:     Rate and Rhythm: Normal rate.  Pulmonary:     Effort: Pulmonary effort is normal.  Abdominal:     General: There is no distension.     Palpations: Abdomen is soft.     Tenderness: There is no abdominal tenderness. There is no guarding.  Musculoskeletal:     Comments: Skin entry sites healing well.  No bruising, induration, hematoma or drainage.  Mild TTP lower lumbar spine.   Skin:    General: Skin is warm and dry.  Neurological:     Mental Status: He is alert and oriented to person, place, and time.  Psychiatric:        Behavior: Behavior normal.       Imaging: IR Radiologist Eval & Mgmt Result Date: 02/12/2024 EXAM: NEW PATIENT OFFICE VISIT CHIEF COMPLAINT: SEE NOTE IN EPIC HISTORY OF PRESENT ILLNESS: SEE NOTE IN EPIC REVIEW OF SYSTEMS: SEE NOTE IN EPIC PHYSICAL EXAMINATION: SEE NOTE IN EPIC ASSESSMENT AND PLAN: SEE NOTE IN EPIC Electronically Signed   By: Fernando Hoyer M.D.   On: 02/12/2024 15:21   IR KYPHO LUMBAR INC FX REDUCE BONE BX UNI/BIL CANNULATION INC/IMAGING Result Date: 02/05/2024 CLINICAL DATA:  78 year old male with highly symptomatic osteoporotic compression fracture of the L1 vertebral body. M80.08XA Age-related osteoporosis with current pathological fracture, vertebra(e), initial encounter for fracture EXAM: FLUOROSCOPIC GUIDED KYPHOPLASTY OF THE L1 VERTEBRAL BODY COMPARISON:  None Available. MEDICATIONS: As antibiotic prophylaxis, 2 g Ancef  was ordered pre-procedure and administered  intravenously within 1 hour of incision. 1 g Tylenol  also administered intravenously following the procedure. Both medications were administered by the radiology nurse. ANESTHESIA/SEDATION: Moderate (conscious) sedation was employed during this procedure. A total of Versed  2 mg and Fentanyl  100 mcg was administered intravenously by the Radiology nurse. Moderate Sedation Time: 27 minutes. The patient's level of consciousness and vital signs were monitored continuously by radiology nursing throughout the procedure under my direct supervision. FLUOROSCOPY TIME:  Radiation exposure index: 44 mGy, air kerma COMPLICATIONS: None immediate. PROCEDURE: The procedure, risks (including but not limited to bleeding, infection, organ damage), benefits, and alternatives  were explained to the patient. Questions regarding the procedure were encouraged and answered. The patient understands and consents to the procedure. The patient has suffered a fracture of the L1 vertebral body. It is recommended that patients aged 2 years or older be evaluated for possible testing or treatment of osteoporosis. The patient was placed prone on the fluoroscopic table. The skin overlying the lumbar region was then prepped and draped in the usual sterile fashion. Maximal barrier sterile technique was utilized including caps, mask, sterile gowns, sterile gloves, sterile drape, hand hygiene and skin antiseptic. Intravenous Fentanyl  and Versed  were administered as conscious sedation during continuous cardiorespiratory monitoring by the radiology RN. The left pedicle at L1 was then infiltrated with 1% lidocaine  followed by the advancement of a Kyphon trocar needle through the left pedicle into the posterior one-third of the vertebral body. Subsequently, the osteo drill was advanced to the anterior third of the vertebral body. The osteo drill was retracted. Through the working cannula, a Kyphon inflatable bone tamp 15 x 2.5 was advanced and positioned with  the distal marker approximately 5 mm from the anterior aspect of the cortex. Appropriate positioning was confirmed on the AP projection. At this time, the balloon was expanded using contrast via a Kyphon inflation syringe device via micro tubing. In similar fashion, the right L1 pedicle was infiltrated with 1% lidocaine  followed by the advancement of a second Kyphon trocar needle through the right pedicle into the posterior third of the vertebral body. Subsequently, the osteo drill was coaxially advanced to the anterior right third. The osteo drill was exchanged for a Kyphon inflatable bone tamp 15 x 2.5, advanced to the 5 mm of the anterior aspect of the cortex. The balloon was then expanded using contrast as above. Inflations were continued until there was near apposition with the superior end plate. At this time, methylmethacrylate mixture was reconstituted in the Kyphon bone mixing device system. This was then loaded into the delivery mechanism, attached to Kyphon bone fillers. The balloons were deflated and removed followed by the instillation of methylmethacrylate mixture with excellent filling in the AP and lateral projections. No extravasation was noted in the disk spaces or posteriorly into the spinal canal. No epidural venous contamination was seen. The working cannulae and the bone filler were then retrieved and removed. Hemostasis was achieved with manual compression. The patient tolerated the procedure well without immediate postprocedural complication. IMPRESSION: 1. Technically successful L1 vertebral body augmentation using balloon kyphoplasty. 2. Per CMS PQRS reporting requirements (PQRS Measure 24): Given the patient's age of greater than 50 and the fracture site (hip, distal radius, or spine), the patient should be tested for osteoporosis using DXA, and the appropriate treatment considered based on the DXA results. Electronically Signed   By: Fernando Hoyer M.D.   On: 02/05/2024 13:27   IR  Radiologist Eval & Mgmt Result Date: 01/29/2024 EXAM: NEW PATIENT OFFICE VISIT CHIEF COMPLAINT: SEE NOTE IN EPIC HISTORY OF PRESENT ILLNESS: SEE NOTE IN EPIC REVIEW OF SYSTEMS: SEE NOTE IN EPIC PHYSICAL EXAMINATION: SEE NOTE IN EPIC ASSESSMENT AND PLAN: SEE NOTE IN EPIC Electronically Signed   By: Fernando Hoyer M.D.   On: 01/29/2024 08:47    Labs:  CBC: Recent Labs    07/13/23 1430 01/29/24 0000  WBC 9.4 8.6  HGB 15.0 14.1  HCT 46.5 43.1  PLT 417* 321    COAGS: Recent Labs    07/13/23 1430 01/29/24 0000  INR 0.9 0.9  APTT  --  31  BMP: Recent Labs    07/13/23 1430 01/29/24 0000  NA 137 138  K 4.6 4.9  CL 100 104  CO2 28 27  GLUCOSE 90 85  BUN 10 9  CALCIUM 9.5 9.2  CREATININE 0.65* 0.70    LIVER FUNCTION TESTS: Recent Labs    07/13/23 1430 01/29/24 0000  BILITOT 0.4 0.3  AST 10 9*  ALT 10 6*  PROT 6.6 6.3    TUMOR MARKERS: No results for input(s): "AFPTM", "CEA", "CA199", "CHROMGRNA" in the last 8760 hours.  Assessment and Plan:  78 year old gentleman with a history of osteoporosis and osteoporotic fractures of T12 and L1 both status post cement augmentation.  His pain is improving, but he continues to have discomfort in the lower lumbar spine which appears to be mechanical in nature and potentially related to the lower lumbar facets.  Additionally, due to his underlying diagnosis of osteoporosis and now multiple compression fractures, he likely warrants treatment for his underlying osteoporosis.  His wife has been treated by Dr. Lorelei Rogers with endocrinology at [facility] clinic.  1.)  Please refer to Dr. Buel Carls with endocrinology at [facility] clinic to evaluate Mr. Schuelke for anabolic therapy for his underlying osteoporosis and fragility fractures.  2.)  I issued him a prescription for methocarbamol  and Anaprox to help with his persistent lower back pain.  If this continues, he may benefit from steroid injections into the lower lumbar facets.   He will reach out to us  if he wants to pursue facet injections.  3.)  We discussed physical therapy but he is not interested in undergoing therapy at this time.    Electronically Signed: Roxie Cord 02/12/2024, 3:52 PM   I spent a total of  15 Minutes in face to face in clinical consultation, greater than 50% of which was counseling/coordinating care for osteoporosis, L1 fracture, low back pain

## 2024-02-29 NOTE — Progress Notes (Unsigned)
 Referring Physician:  Jimmy Moulding, MD [address] [place] [facility] [place],  [place] [address]  Primary Physician:  Adam Moulding, MD  History of Present Illness: [name] has a history of chronic bronchitis.   History of L4-S1 spinal fusion. I have been following him for an L2 compression fracture s/p fall on around 04/02/23.   He had kyphoplasty L2 by IR on 07/18/23. Had increased pain in March and was found to have L1 compression fracture.   He had L1 kyphoplasty by IR on 02/05/24. He was to follow up with endocrine regarding his osteoporosis.   Dr. Marne Fuller saw him on 02/12/24- his thoracic pain was better, but he continued with lower back pain. He was given robaxin  and anaprox . He discussed possible facet injections. PT was declined.   He is here for follow up.   He continues with intermittent LBP with left lateral thigh pain to his knee. He's had this pain for years. Pain is worse with prolonged standing and walking. No numbness or tingling. He notes weakness in left leg.   Bowel/Bladder Dysfunction: none  Conservative measures:  Physical therapy:  none Multimodal medical therapy including regular antiinflammatories:  Ibuprofen, Prednisone, Metaxalone, Hydrocodone Injections:  no epidural steroid injections  Past Surgery:  L1 kyphoplasty on 02/05/24 L2 kyphoplasty on 07/18/23 3 previous back surgeries   Adam Fuller has no symptoms of cervical myelopathy.  The symptoms are causing a significant impact on the patient's life.   Review of Systems:  A 10 point review of systems is negative, except for the pertinent positives and negatives detailed in the HPI.  Past Medical History: No past medical history on file.  Past Surgical History: Past Surgical History:  Procedure Laterality Date   BACK SURGERY     IR KYPHO LUMBAR INC FX REDUCE BONE BX UNI/BIL CANNULATION INC/IMAGING  07/18/2023   IR KYPHO LUMBAR INC FX REDUCE  BONE BX UNI/BIL CANNULATION INC/IMAGING  02/05/2024   IR RADIOLOGIST EVAL & MGMT  07/13/2023   IR RADIOLOGIST EVAL & MGMT  01/29/2024   IR RADIOLOGIST EVAL & MGMT  02/12/2024    Allergies: Allergies as of 03/05/2024 - Review Complete 02/12/2024  Allergen Reaction Noted   Varenicline Other (See Comments) 02/11/2013    Medications:  Current Outpatient Medications:    ibuprofen (ADVIL) 200 MG tablet, Take 200 mg by mouth every 6 (six) hours as needed., Disp: , Rfl:    methocarbamol  (ROBAXIN ) 750 MG tablet, Take 1 tablet (750 mg total) by mouth every 6 (six) hours as needed for muscle spasms., Disp: 120 tablet, Rfl: 0   oxyCODONE  (ROXICODONE ) 5 MG immediate release tablet, Take 1 tablet (5 mg total) by mouth every 6 (six) hours as needed for severe pain (pain score 7-10)., Disp: 20 tablet, Rfl: 0  Social History: Social History   Tobacco Use   Smoking status: Every Day   Smokeless tobacco: Never  Substance Use Topics   Alcohol use: Yes    Family Medical History: No family history on file.  Physical Examination: There were no vitals filed for this visit.  Awake, alert, oriented to person, place, and time.  Speech is clear and fluent.   Cranial Nerves: Pupils equal round and reactive to light.  Facial tone is symmetric.  Facial sensation is symmetric.   Strength: Side Iliopsoas Quads Hamstring PF DF EHL  R 5 5 5 5 5 5   L 5 5 5 5 5  5  Reflexes are 2+ and symmetric at the patella and achilles.   Hoffman's is absent.  Clonus is not present.    No pain with IR/ER of both hips.   Bilateral lower extremity sensation is intact to light touch.     His gait is slow.    Medical Decision Making  Imaging: none  Assessment and Plan: [name] upper lumbar pain is improved after recent L1 kyphoplasty.   He continues with intermittent LBP with left lateral thigh pain to his knee. He's had this pain for years. Pain is worse with prolonged standing and walking. No numbness or  tingling. He notes weakness in left leg.   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   Treatment options reviewed with patient and following plan made:   - Agree with referral to endocrine for osteoporosis. He will call Dr. Lorelei Fuller at [facility]. Referral done by Dr. Marne Fuller.  - Discussed PT for lumbar spine and he declines.  - Referral to pain management (Adam Fuller) to discuss possible lumbar injections versus SCS.  - Follow up with me in 6-8 weeks and prn.   I spent a total of 25 minutes in face-to-face and non-face-to-face activities related to this patient's care today including review of outside records, review of imaging, review of symptoms, physical exam, discussion of differential diagnosis, discussion of treatment options, and documentation.   Adam Russel PA-C Neurosurgery

## 2024-03-05 ENCOUNTER — Ambulatory Visit (INDEPENDENT_AMBULATORY_CARE_PROVIDER_SITE_OTHER): Admitting: Orthopedic Surgery

## 2024-03-05 ENCOUNTER — Encounter: Payer: Self-pay | Admitting: Orthopedic Surgery

## 2024-03-05 VITALS — BP 128/82 | Ht 71.0 in | Wt 145.0 lb

## 2024-03-05 DIAGNOSIS — M5416 Radiculopathy, lumbar region: Secondary | ICD-10-CM

## 2024-03-05 DIAGNOSIS — Z981 Arthrodesis status: Secondary | ICD-10-CM | POA: Diagnosis not present

## 2024-03-05 DIAGNOSIS — M48061 Spinal stenosis, lumbar region without neurogenic claudication: Secondary | ICD-10-CM

## 2024-03-05 DIAGNOSIS — M47816 Spondylosis without myelopathy or radiculopathy, lumbar region: Secondary | ICD-10-CM

## 2024-03-05 DIAGNOSIS — M4726 Other spondylosis with radiculopathy, lumbar region: Secondary | ICD-10-CM

## 2024-03-05 NOTE — Patient Instructions (Signed)
 It was so nice to see you today. Thank you so much for coming in.    I want you to see pain management here in [place] (Dr. Rhesa Celeste) to discuss possible lumbar injections and/or spinal cord stimulator. They should call you to schedule an appointment or you can call them at [phone].   Call endocrinology at [facility]- Dr. Lavanda Porter. You need to see her about your osteoporosis. Her number is [phone].   [facility] [phone].   I will see you back in 8 weeks. Please do not hesitate to call if you have any questions or concerns. You can also message me in MyChart.   Lucetta Russel PA-C [phone]     The physicians and staff at [facility] Neurosurgery at [facility] are committed to providing excellent care. You may receive a survey asking for feedback about your experience at our office. We value you your feedback and appreciate you taking the time to to fill it out. The [facility] leadership team is also available to discuss your experience in person, feel free to contact us  [phone].

## 2024-04-15 ENCOUNTER — Ambulatory Visit: Admitting: Student in an Organized Health Care Education/Training Program

## 2024-04-27 NOTE — Progress Notes (Deleted)
 Referring Physician:  Lenon Layman ORN, MD [address] [facility] [place],  [place] [address]  Primary Physician:  Lenon Layman ORN, MD  History of Present Illness: [name] has a history of chronic bronchitis.   History of L4-S1 spinal fusion. I have been following him for an L2 compression fracture s/p fall on around 04/02/23.   He had kyphoplasty L2 by IR on 07/18/23. Had increased pain in March and was found to have L1 compression fracture.   He had L1 kyphoplasty by IR on 02/05/24. He was to follow up with endocrine regarding his osteoporosis.   Dr. Karalee saw him on 02/12/24- his thoracic pain was better, but he continued with lower back pain. He was given robaxin  and anaprox . He discussed possible facet injections. PT was declined.   Last seen by me on 03/05/24. He was referred to Dr. Marcelino and missed his appointment.   He is here for follow up.              He continues with intermittent LBP with left lateral thigh pain to his knee. He's had this pain for years. Pain is worse with prolonged standing and walking. No numbness or tingling. He notes weakness in left leg.   Bowel/Bladder Dysfunction: none  Conservative measures:  Physical therapy:  none Multimodal medical therapy including regular antiinflammatories:  Ibuprofen, Prednisone, Metaxalone, Hydrocodone Injections:  no epidural steroid injections  Past Surgery:  L1 kyphoplasty on 02/05/24 L2 kyphoplasty on 07/18/23 3 previous back surgeries   Adam Fuller has no symptoms of cervical myelopathy.  The symptoms are causing a significant impact on the patient's life.   Review of Systems:  A 10 point review of systems is negative, except for the pertinent positives and negatives detailed in the HPI.  Past Medical History: No past medical history on file.  Past Surgical History: Past Surgical History:  Procedure Laterality Date   BACK SURGERY     IR  KYPHO LUMBAR INC FX REDUCE BONE BX UNI/BIL CANNULATION INC/IMAGING  07/18/2023   IR KYPHO LUMBAR INC FX REDUCE BONE BX UNI/BIL CANNULATION INC/IMAGING  02/05/2024   IR RADIOLOGIST EVAL & MGMT  07/13/2023   IR RADIOLOGIST EVAL & MGMT  01/29/2024   IR RADIOLOGIST EVAL & MGMT  02/12/2024    Allergies: Allergies as of 04/30/2024 - Review Complete 03/05/2024  Allergen Reaction Noted   Varenicline Other (See Comments) 02/11/2013    Medications:  Current Outpatient Medications:    ibuprofen (ADVIL) 200 MG tablet, Take 200 mg by mouth every 6 (six) hours as needed., Disp: , Rfl:    methocarbamol  (ROBAXIN ) 750 MG tablet, Take 1 tablet (750 mg total) by mouth every 6 (six) hours as needed for muscle spasms., Disp: 120 tablet, Rfl: 0  Social History: Social History   Tobacco Use   Smoking status: Every Day   Smokeless tobacco: Never  Substance Use Topics   Alcohol use: Yes    Family Medical History: No family history on file.  Physical Examination: There were no vitals filed for this visit.  Awake, alert, oriented to person, place, and time.  Speech is clear and fluent.   Cranial Nerves: Pupils equal round and reactive to light.  Facial tone is symmetric.  Facial sensation is symmetric.   Strength: Side Iliopsoas Quads Hamstring PF DF EHL  R 5 5 5 5 5 5   L 5 5 5 5 5 5    Reflexes are 2+ and  symmetric at the patella and achilles.   Hoffman's is absent.  Clonus is not present.    No pain with IR/ER of both hips.   Bilateral lower extremity sensation is intact to light touch.     His gait is slow.    Medical Decision Making  Imaging: none  Assessment and Plan: Mr. Schalk upper lumbar pain is improved after recent L1 kyphoplasty.   He continues with intermittent LBP with left lateral thigh pain to his knee. He's had this pain for years. Pain is worse with prolonged standing and walking. No numbness or tingling. He notes weakness in left leg.   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   Treatment options reviewed with patient and following plan made:   - Agree with referral to endocrine for osteoporosis. He will call Dr. Damian at [facility]. Referral done by Dr. Karalee.  - Discussed PT for lumbar spine and he declines.  - Referral to pain management (Lateef) to discuss possible lumbar injections versus SCS.  - Follow up with me in 6-8 weeks and prn.   I spent a total of 25 minutes in face-to-face and non-face-to-face activities related to this patient's care today including review of outside records, review of imaging, review of symptoms, physical exam, discussion of differential diagnosis, discussion of treatment options, and documentation.   Glade Boys PA-C Neurosurgery

## 2024-04-28 ENCOUNTER — Other Ambulatory Visit: Payer: Self-pay | Admitting: Internal Medicine

## 2024-04-28 DIAGNOSIS — Z720 Tobacco use: Secondary | ICD-10-CM

## 2024-04-28 DIAGNOSIS — F17210 Nicotine dependence, cigarettes, uncomplicated: Secondary | ICD-10-CM

## 2024-04-30 ENCOUNTER — Ambulatory Visit: Admitting: Orthopedic Surgery

## 2024-05-23 ENCOUNTER — Other Ambulatory Visit: Payer: Self-pay | Admitting: Interventional Radiology

## 2024-05-23 MED ORDER — METHOCARBAMOL 750 MG PO TABS
750.0000 mg | ORAL_TABLET | Freq: Four times a day (QID) | ORAL | 0 refills | Status: DC | PRN
Start: 2024-05-23 — End: 2024-08-07

## 2024-06-25 ENCOUNTER — Ambulatory Visit

## 2024-08-07 ENCOUNTER — Telehealth (HOSPITAL_COMMUNITY): Payer: Self-pay | Admitting: Student

## 2024-08-07 MED ORDER — METHOCARBAMOL 750 MG PO TABS: 750.0000 mg | ORAL_TABLET | Freq: Four times a day (QID) | ORAL | 0 refills | Status: AC | PRN

## 2024-08-07 NOTE — Telephone Encounter (Signed)
 Patient with a history of L1 and L2 compression fractures s/p vertebral body augmentations 02/05/24 and 07/18/23 respectively. Patient last followed up with Dr. Karalee 02/12/24 and complained of persistent back pain. Dr. Karalee issued a prescription for methocarbamol  and Anaprox . Dr. Karalee also offered steroid injections into the lumbar facets and he encouraged the patient to reach out to us  if he wished to undergo ESI.   The patient called IR today requesting a refill of methocarbamol . Patient made aware that a short-term prescription (7 days) will be sent in but he needs to be evaluated by our team for any further prescriptions. Patient encouraged to request a clinic evaluation for additional back pain work up.    Warren Dais, AGACNP-BC 08/07/2024, 3:29 PM
